# Patient Record
Sex: Female | Born: 1985 | Race: White | Hispanic: No | Marital: Married | Smoking: Former smoker
Health system: Southern US, Community
[De-identification: ages and names within clinical notes are randomized; demographics above are authoritative.]

## PROBLEM LIST (undated history)

## (undated) DIAGNOSIS — K219 Gastro-esophageal reflux disease without esophagitis: Secondary | ICD-10-CM

## (undated) DIAGNOSIS — I7300 Raynaud's syndrome without gangrene: Secondary | ICD-10-CM

## (undated) DIAGNOSIS — I10 Essential (primary) hypertension: Secondary | ICD-10-CM

## (undated) DIAGNOSIS — M3214 Glomerular disease in systemic lupus erythematosus: Secondary | ICD-10-CM

## (undated) HISTORY — PX: OTHER SURGICAL HISTORY: SHX169

## (undated) HISTORY — PX: TONSILLECTOMY AND ADENOIDECTOMY: SHX28

## (undated) HISTORY — PX: RENAL BIOPSY: SHX156

## (undated) HISTORY — PX: WISDOM TOOTH EXTRACTION: SHX21

---
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
## 2005-10-20 ENCOUNTER — Encounter: Payer: Self-pay | Admitting: Vascular Surgery

## 2005-10-20 ENCOUNTER — Ambulatory Visit (HOSPITAL_COMMUNITY): Admission: RE | Admit: 2005-10-20 | Discharge: 2005-10-20 | Payer: Self-pay | Admitting: Rheumatology

## 2006-02-23 ENCOUNTER — Emergency Department (HOSPITAL_COMMUNITY): Admission: EM | Admit: 2006-02-23 | Discharge: 2006-02-23 | Payer: Self-pay | Admitting: Emergency Medicine

## 2006-03-08 ENCOUNTER — Ambulatory Visit: Payer: Self-pay

## 2006-03-15 ENCOUNTER — Encounter: Payer: Self-pay | Admitting: Cardiology

## 2006-03-15 ENCOUNTER — Ambulatory Visit: Payer: Self-pay

## 2006-06-10 ENCOUNTER — Encounter: Admission: RE | Admit: 2006-06-10 | Discharge: 2006-06-10 | Payer: Self-pay | Admitting: Rheumatology

## 2006-06-16 ENCOUNTER — Encounter: Admission: RE | Admit: 2006-06-16 | Discharge: 2006-06-16 | Payer: Self-pay | Admitting: Rheumatology

## 2006-11-03 ENCOUNTER — Ambulatory Visit (HOSPITAL_COMMUNITY): Admission: RE | Admit: 2006-11-03 | Discharge: 2006-11-03 | Payer: Self-pay | Admitting: Internal Medicine

## 2007-01-12 ENCOUNTER — Ambulatory Visit: Payer: Self-pay | Admitting: Hematology and Oncology

## 2007-01-20 LAB — URINALYSIS, MICROSCOPIC - CHCC: Glucose: NEGATIVE g/dL

## 2007-01-20 LAB — CBC & DIFF AND RETIC
IRF: 0.25 (ref 0.130–0.330)
LYMPH%: 18.3 % (ref 14.0–48.0)
MCH: 23.9 pg — ABNORMAL LOW (ref 26.0–34.0)
MCHC: 33.3 g/dL (ref 32.0–36.0)
MCV: 71.9 fL — ABNORMAL LOW (ref 81.0–101.0)
MONO%: 3.1 % (ref 0.0–13.0)
RBC: 3.32 10*6/uL — ABNORMAL LOW (ref 3.70–5.32)
Retic %: 0.9 % (ref 0.4–2.3)
WBC: 2.7 10*3/uL — ABNORMAL LOW (ref 3.9–10.0)
lymph#: 0.5 10*3/uL — ABNORMAL LOW (ref 0.9–3.3)

## 2007-01-21 ENCOUNTER — Inpatient Hospital Stay (HOSPITAL_COMMUNITY): Admission: EM | Admit: 2007-01-21 | Discharge: 2007-01-25 | Payer: Self-pay | Admitting: Emergency Medicine

## 2007-01-21 ENCOUNTER — Ambulatory Visit: Payer: Self-pay | Admitting: Hematology and Oncology

## 2007-01-24 LAB — IRON AND TIBC
Iron: 19 ug/dL — ABNORMAL LOW (ref 42–145)
TIBC: 222 ug/dL — ABNORMAL LOW (ref 250–470)

## 2007-01-24 LAB — FERRITIN: Ferritin: 362 ng/mL — ABNORMAL HIGH (ref 10–291)

## 2007-01-24 LAB — HEMOGLOBINOPATHY EVALUATION
Hemoglobin Other: 0 % (ref 0.0–0.0)
Hgb A: 97.1 % (ref 96.8–97.8)
Hgb F Quant: 0 % (ref 0.0–2.0)
Hgb S Quant: 0 % (ref 0.0–0.0)

## 2007-01-24 LAB — COMPREHENSIVE METABOLIC PANEL
Alkaline Phosphatase: 49 U/L (ref 39–117)
CO2: 24 mEq/L (ref 19–32)
Calcium: 7.9 mg/dL — ABNORMAL LOW (ref 8.4–10.5)
Creatinine, Ser: 0.56 mg/dL (ref 0.40–1.20)
Potassium: 4.1 mEq/L (ref 3.5–5.3)
Total Bilirubin: 0.4 mg/dL (ref 0.3–1.2)

## 2007-01-24 LAB — PROTEIN ELECTROPHORESIS, SERUM
Albumin ELP: 43.9 % — ABNORMAL LOW (ref 55.8–66.1)
Alpha-1-Globulin: 6.3 % — ABNORMAL HIGH (ref 2.9–4.9)
Gamma Globulin: 26.4 % — ABNORMAL HIGH (ref 11.1–18.8)

## 2007-01-25 ENCOUNTER — Encounter: Payer: Self-pay | Admitting: Hematology and Oncology

## 2007-02-04 LAB — BASIC METABOLIC PANEL
BUN: 16 mg/dL (ref 6–23)
CO2: 24 mEq/L (ref 19–32)
Chloride: 108 mEq/L (ref 96–112)
Creatinine, Ser: 0.63 mg/dL (ref 0.40–1.20)

## 2007-02-04 LAB — CBC WITH DIFFERENTIAL/PLATELET
BASO%: 0.1 % (ref 0.0–2.0)
EOS%: 1.6 % (ref 0.0–7.0)
HCT: 30.2 % — ABNORMAL LOW (ref 34.8–46.6)
MCH: 24.9 pg — ABNORMAL LOW (ref 26.0–34.0)
MCHC: 33.5 g/dL (ref 32.0–36.0)
MONO#: 0.1 10*3/uL (ref 0.1–0.9)
NEUT%: 61.4 % (ref 39.6–76.8)
RBC: 4.06 10*6/uL (ref 3.70–5.32)
RDW: 20 % — ABNORMAL HIGH (ref 11.3–14.5)
WBC: 2.2 10*3/uL — ABNORMAL LOW (ref 3.9–10.0)
lymph#: 0.7 10*3/uL — ABNORMAL LOW (ref 0.9–3.3)

## 2007-02-04 LAB — TECHNOLOGIST REVIEW

## 2007-02-10 DIAGNOSIS — M3214 Glomerular disease in systemic lupus erythematosus: Secondary | ICD-10-CM

## 2007-02-10 HISTORY — DX: Glomerular disease in systemic lupus erythematosus: M32.14

## 2007-02-18 ENCOUNTER — Ambulatory Visit (HOSPITAL_COMMUNITY): Admission: RE | Admit: 2007-02-18 | Discharge: 2007-02-19 | Payer: Self-pay | Admitting: Nephrology

## 2007-02-18 ENCOUNTER — Encounter (INDEPENDENT_AMBULATORY_CARE_PROVIDER_SITE_OTHER): Payer: Self-pay | Admitting: Nephrology

## 2007-03-03 ENCOUNTER — Ambulatory Visit (HOSPITAL_COMMUNITY): Admission: RE | Admit: 2007-03-03 | Discharge: 2007-03-03 | Payer: Self-pay | Admitting: Nephrology

## 2007-03-14 ENCOUNTER — Encounter: Admission: RE | Admit: 2007-03-14 | Discharge: 2007-03-14 | Payer: Self-pay | Admitting: Rheumatology

## 2007-04-06 ENCOUNTER — Encounter (HOSPITAL_COMMUNITY): Admission: RE | Admit: 2007-04-06 | Discharge: 2007-07-05 | Payer: Self-pay | Admitting: Nephrology

## 2008-02-22 IMAGING — CR DG CHEST 2V
2 series · 2 of 2 positions shown · non-contrast
Comparison: None available.

CLINICAL DATA: Chest pain and shortness of breath since [REDACTED].  History of Lupus.  
CHEST - 2 VIEW:

[view not recorded (1 of 2)]
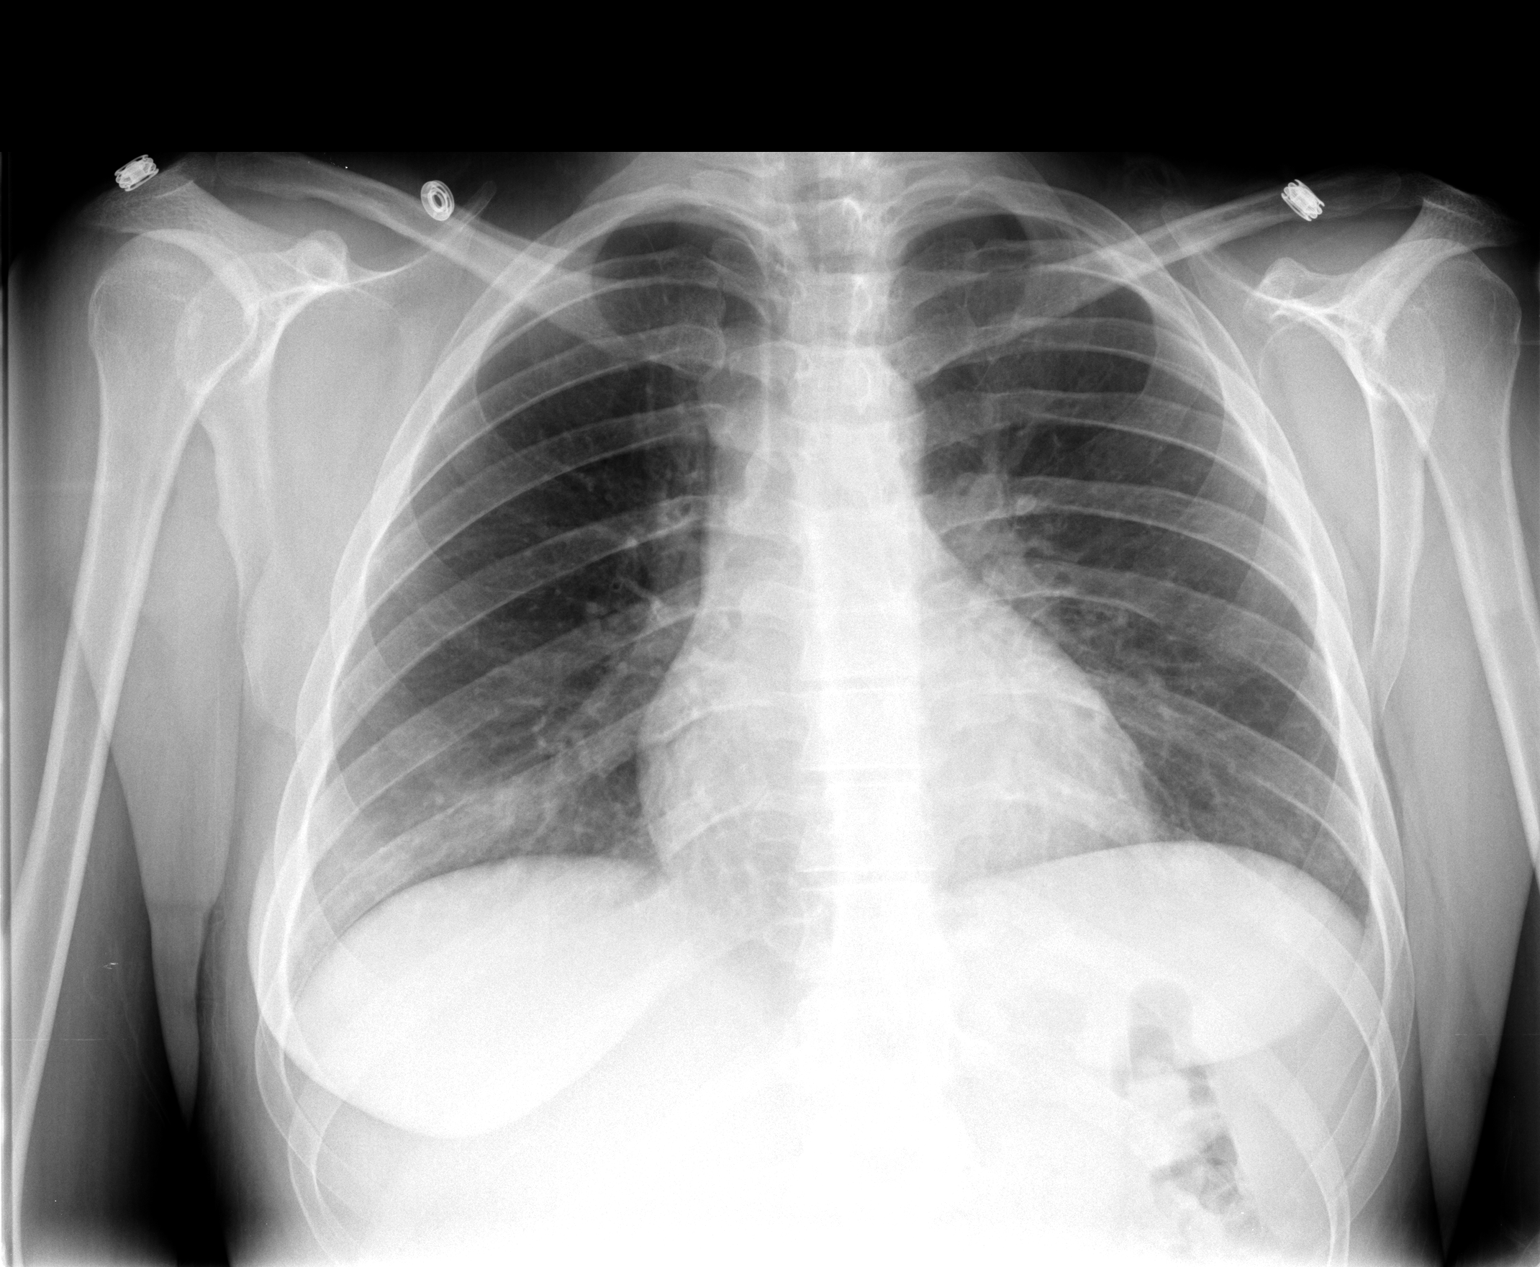

[view not recorded (2 of 2)]
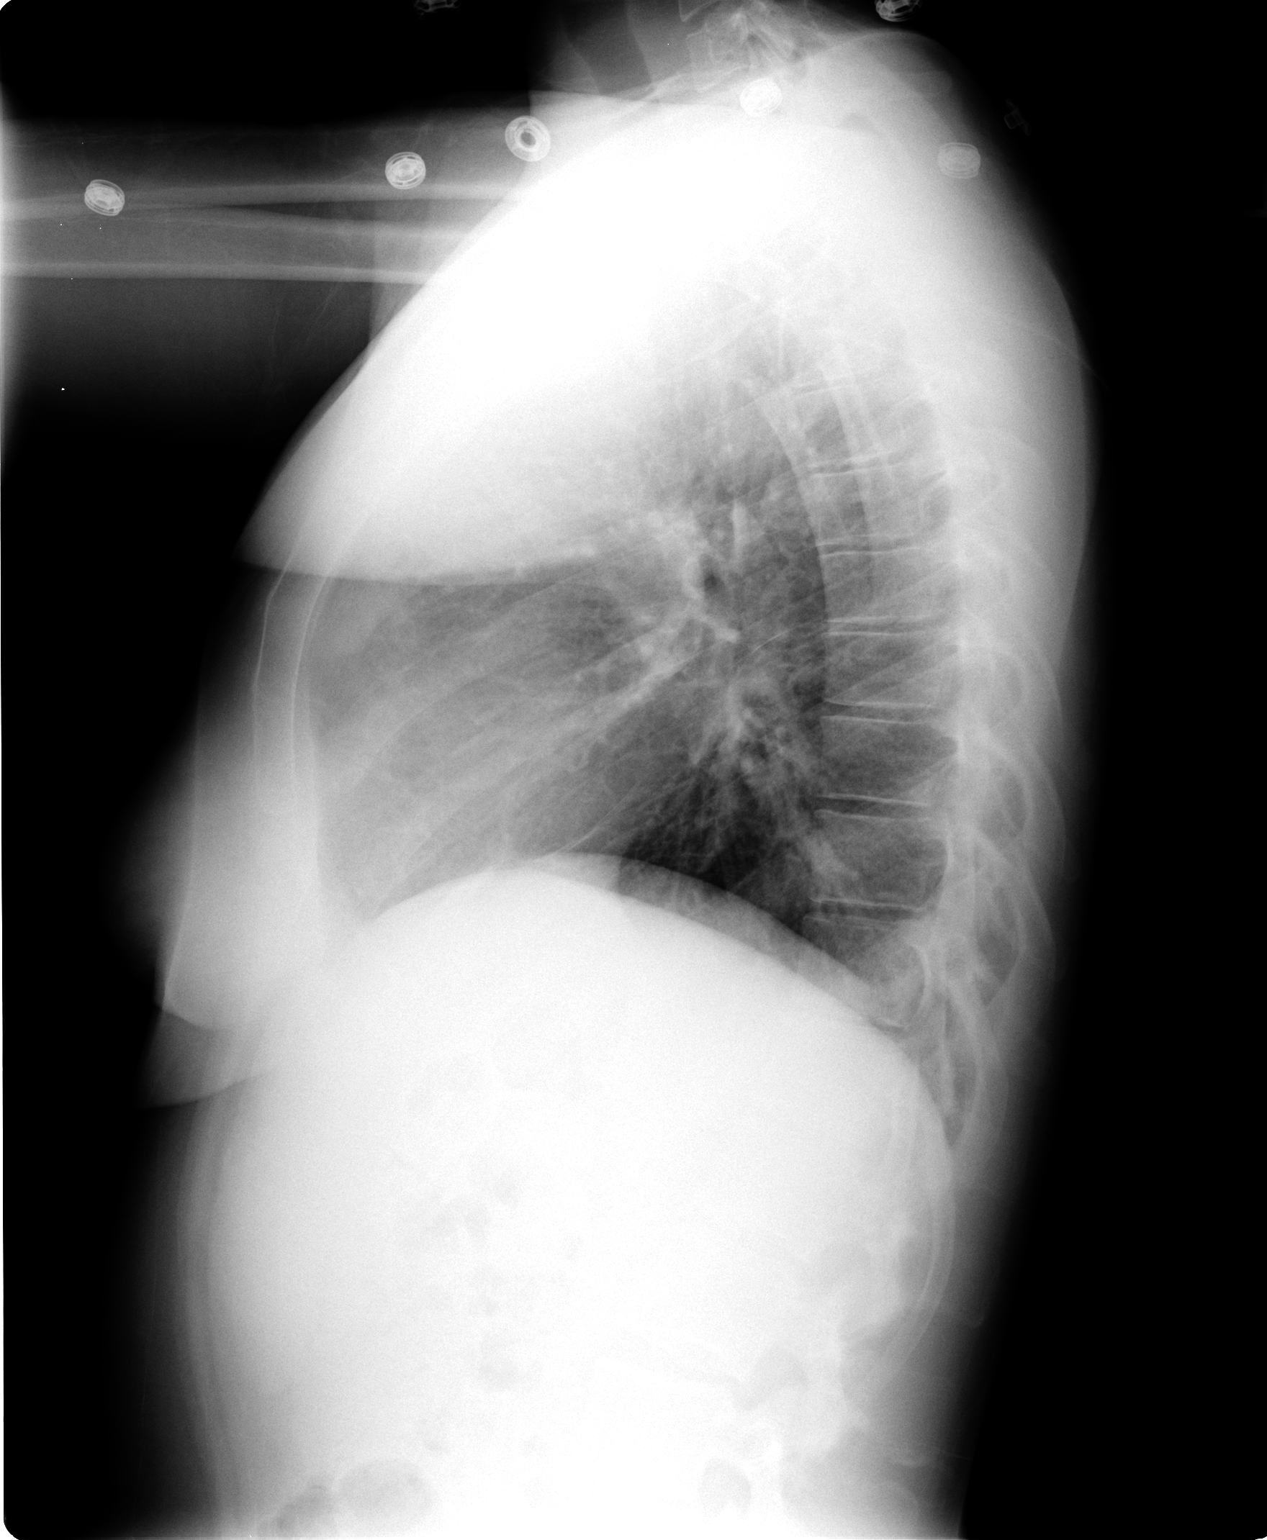

[2 of 2 positions shown; findings below may reference images not displayed]

FINDINGS: The trachea is midline.  The heart size is normal.  Mediastinal contours are within normal limits.  Costophrenic angles are sharp.  There is patchy right lower lobe airspace disease most apparent on the frontal view.  Left lung is clear.
IMPRESSION: Patchy right lower lobe airspace disease most consistent with pneumonia or atelectasis.

## 2008-02-22 IMAGING — CT CT ANGIO CHEST
2 of 4 series · 19 of 36 positions shown · IV contrast (APPLIED)
Comparison: Plain film earlier in the day.

CLINICAL DATA: Chest pain.  Rule out pulmonary embolism.
CT ANGIOGRAPHY OF CHEST:
TECHNIQUE: Multidetector CT imaging of the chest was performed during bolus injection of intravenous contrast.  Multiplanar CT angiographic image reconstructions were generated to evaluate the vascular anatomy.
Contrast:  80 cc Omnipaque 300.

[Series 4: pulm embolism 2.0 st · axial · 0.61mm/px · z∈[-285,-37]mm · 16 of 136 slices shown]
[im 6/136  lung]
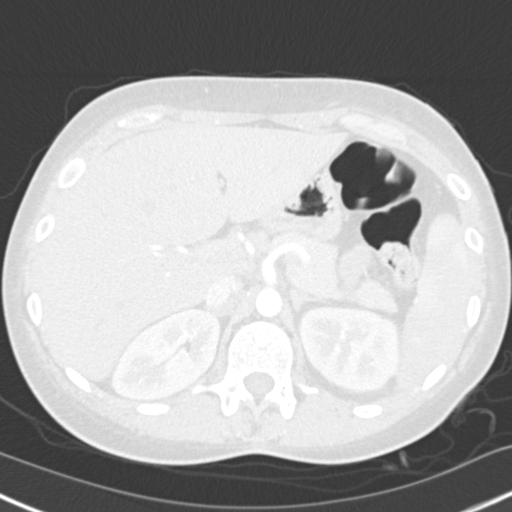
[im 17/136  mediastinal]
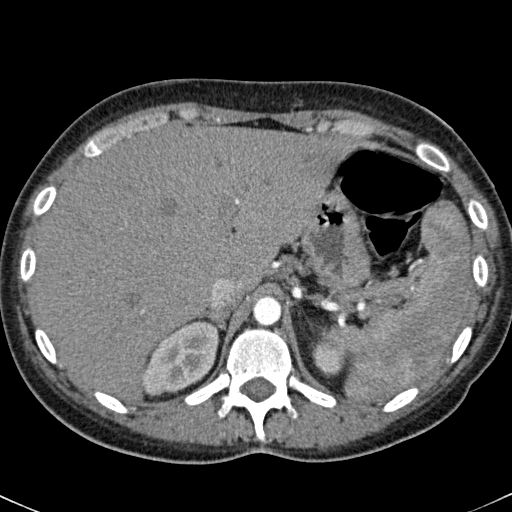
[im 22/136  lung]
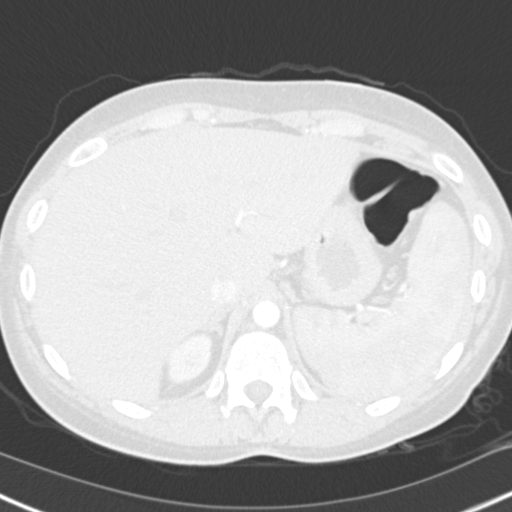
[im 33/136  mediastinal]
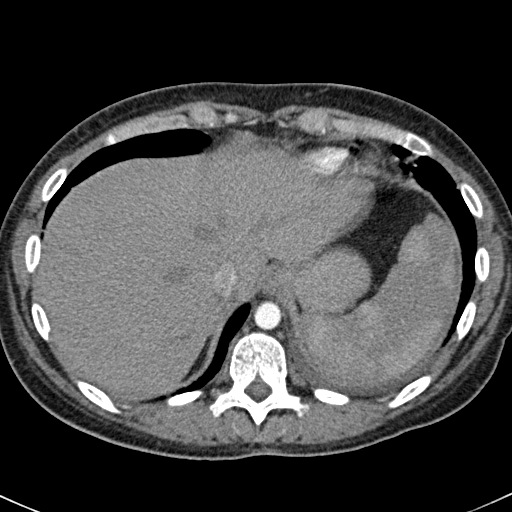
[im 38/136  lung]
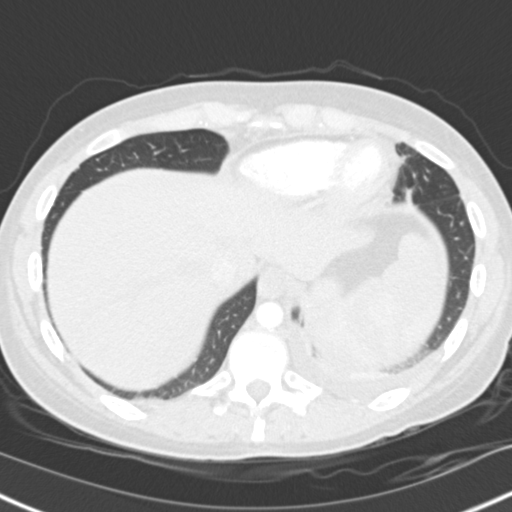
[im 49/136  mediastinal]
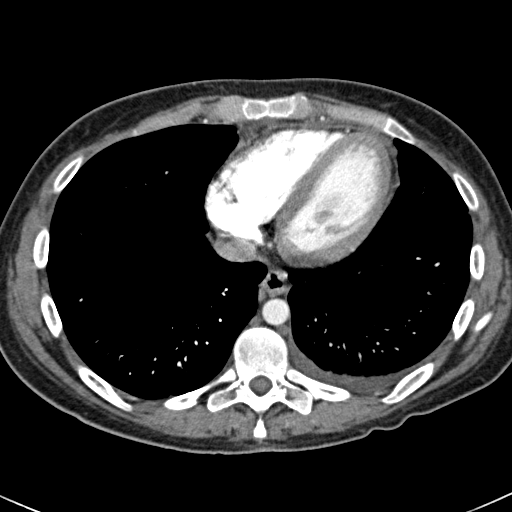
[im 55/136  lung]
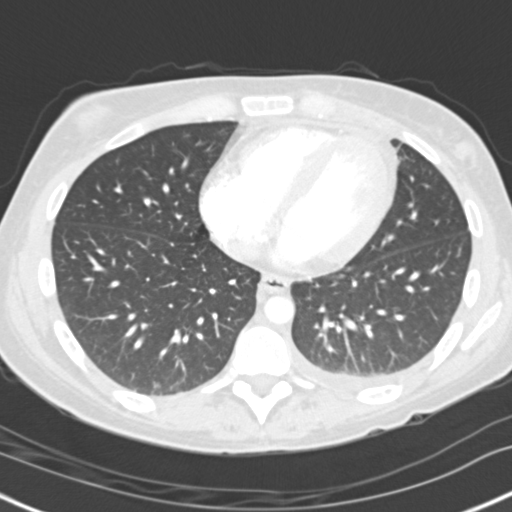
[im 65/136  mediastinal]
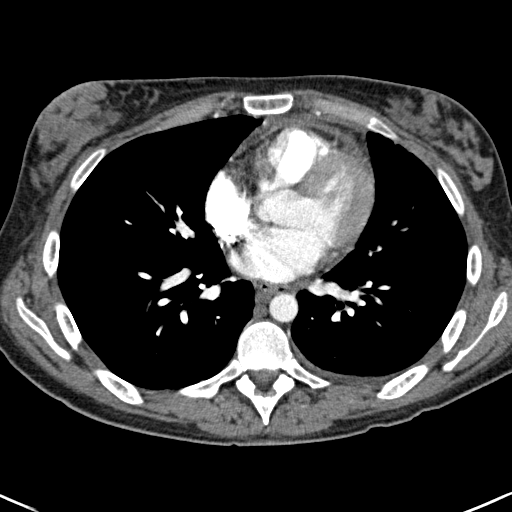
[im 71/136  lung]
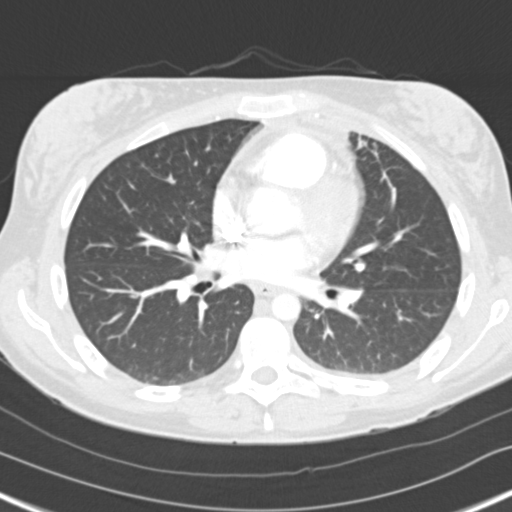
[im 82/136  mediastinal]
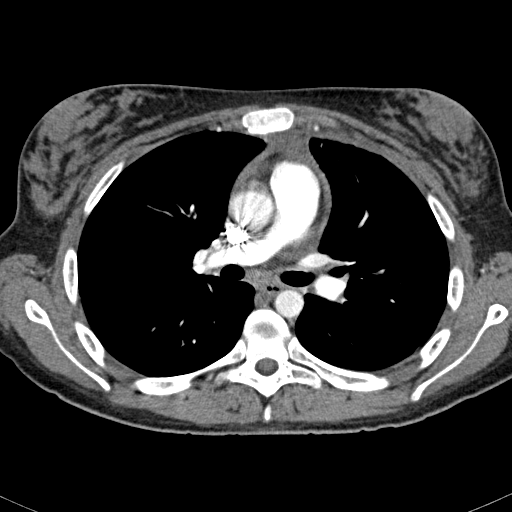
[im 87/136  lung]
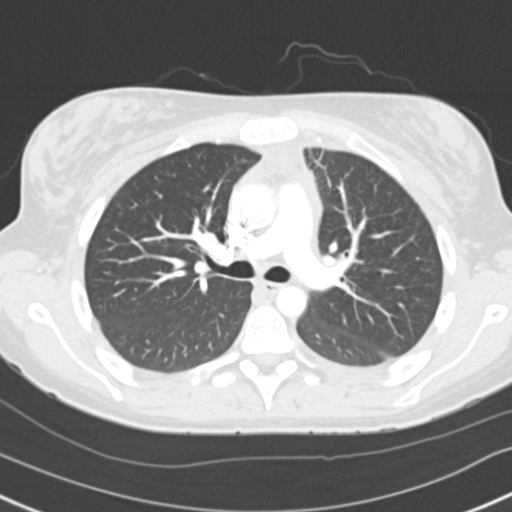
[im 98/136  mediastinal]
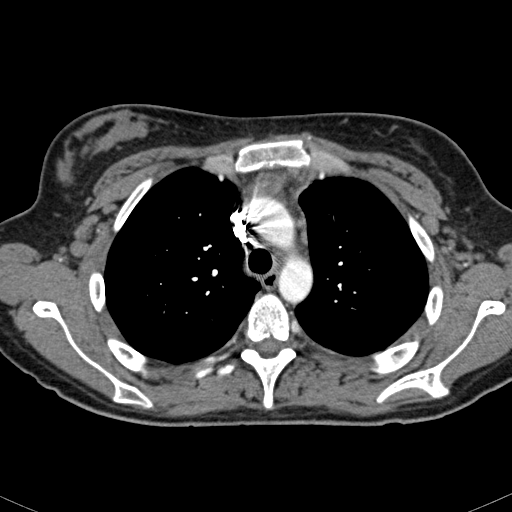
[im 103/136  lung]
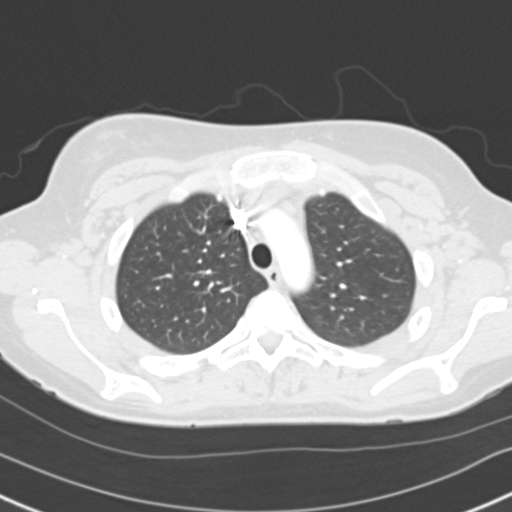
[im 114/136  mediastinal]
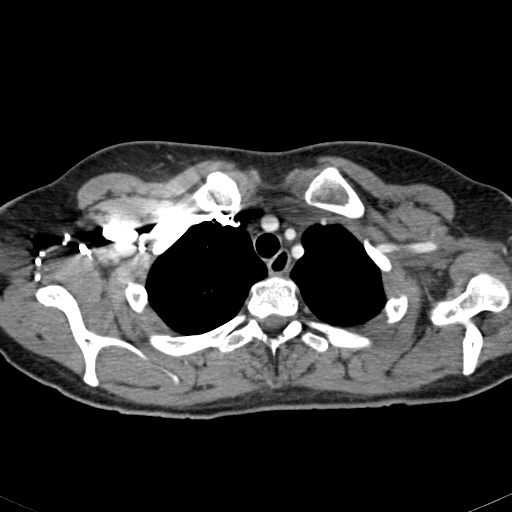
[im 119/136  lung]
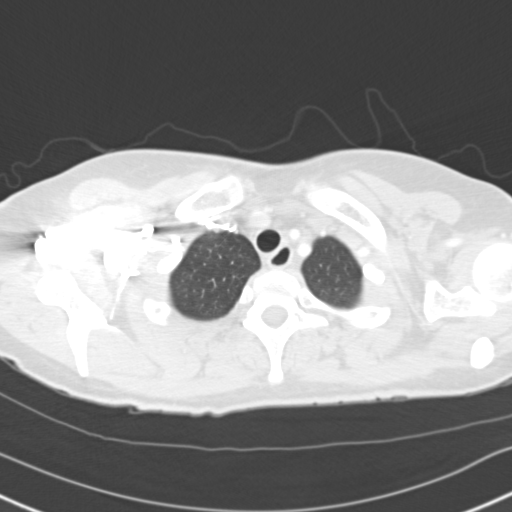
[im 130/136  mediastinal]
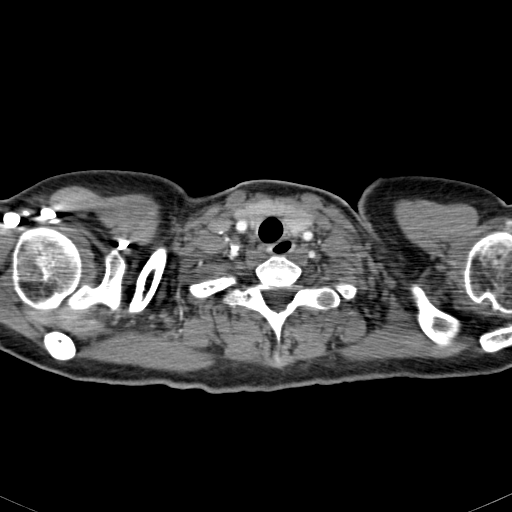

[Series 6: pulm embolism 2.0 cor · coronal · 0.63mm/px · 3 of 109 slices shown]
[im 22/109  mediastinal]
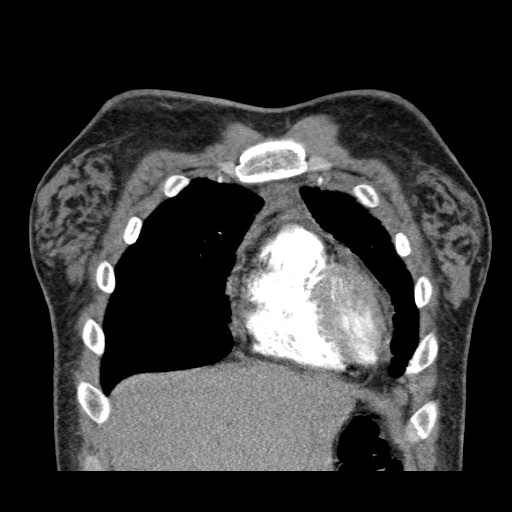
[im 44/109  mediastinal]
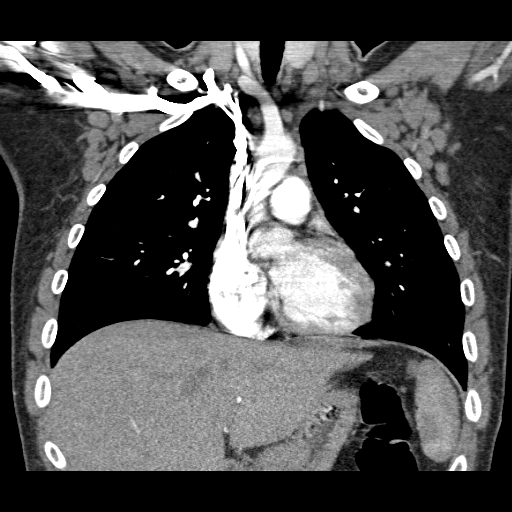
[im 65/109  mediastinal]
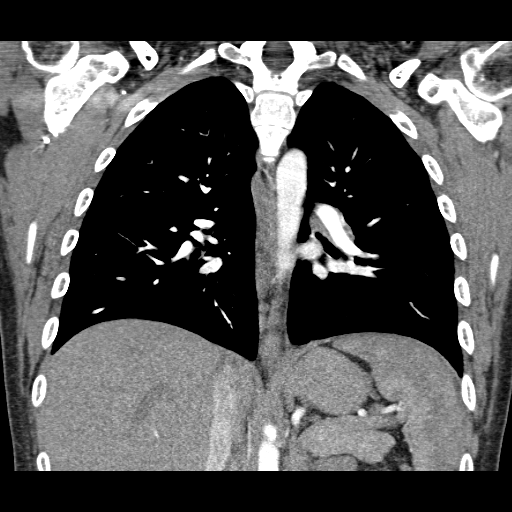

[19 of 36 positions shown; findings below may reference images not displayed]

FINDINGS: Lung windows demonstrate no airspace opacities.  The right lower lobe opacity on the plain film was likely related to atelectasis, which has resolved.  Minimal scarring involves the lingula.  
Soft tissue windows:  The quality of this exam for evaluation of pulmonary embolism is good.  No filling defect is in the pulmonary arterial tree to suggest pulmonary embolism.  There is axillary adenopathy with increased size and number of bilateral axillary nodes.  There is left greater than right supraclavicular adenopathy.  The heart size is normal.  There is trace pericardial fluid.  There is a small left-sided pleural effusion.  Numerous small mediastinal lymph nodes.  Prominent left infrahilar nodal tissue.  
Limited imaging of the upper abdomen demonstrates increased number of small porta hepatis and gastrohepatic ligament lymph nodes.  
bone windows demonstrate no worrisome osseous lesion.
IMPRESSION: 1.  No evidence of pulmonary embolism.
2.  Small left-sided pleural effusion and trace pericardial fluid may relate to the given history of lupus. 
3.  The right lower lobe airspace opacity does not persist and was likely related to atelectasis.  
4.  Thoracic adenopathy.  Although this could relate to chronic inflammation in this patient with lupus, consider other etiologies including lymphoproliferative disorders.  Consider initially correlation with blood work and physical exam.  If there is a suspicion of lymphoma or other lymphoproliferative process, consider PET/CT.

## 2008-08-13 ENCOUNTER — Inpatient Hospital Stay (HOSPITAL_COMMUNITY): Admission: AD | Admit: 2008-08-13 | Discharge: 2008-08-13 | Payer: Self-pay | Admitting: Obstetrics and Gynecology

## 2008-09-10 ENCOUNTER — Inpatient Hospital Stay (HOSPITAL_COMMUNITY): Admission: AD | Admit: 2008-09-10 | Discharge: 2008-09-13 | Payer: Self-pay | Admitting: Obstetrics & Gynecology

## 2008-10-12 ENCOUNTER — Encounter: Payer: Self-pay | Admitting: Gastroenterology

## 2009-01-19 IMAGING — CR DG CHEST 1V PORT
1 series · 1 of 1 positions shown · non-contrast
Comparison: 01/20/07.

CLINICAL DATA: Pneumonia. 
 PORTABLE CHEST - 1 VIEW:

[view not recorded]
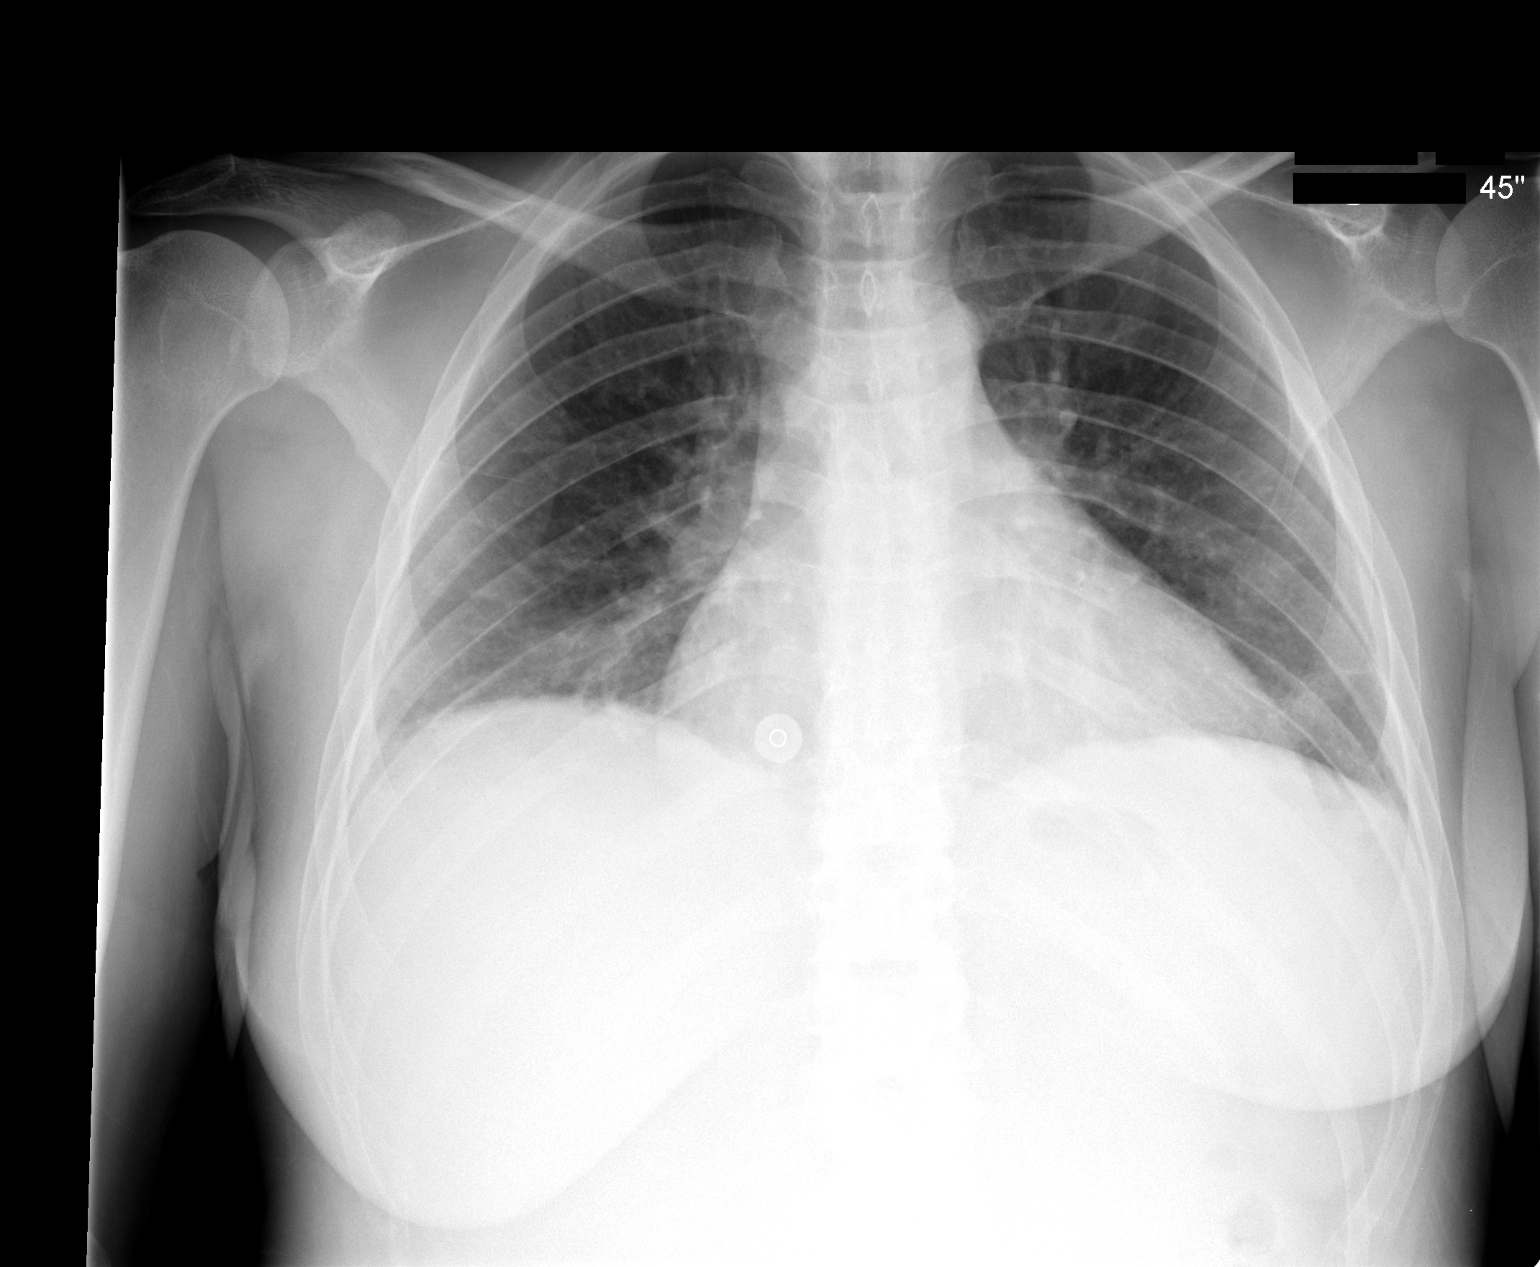

[1 of 1 positions shown; findings below may reference images not displayed]

FINDINGS: Patchy density at the right base is stable.  The heart is enlarged.  No pneumothoraces or effusions are seen.  A small right pleural effusion is not significantly changed.
IMPRESSION: No interval change.

## 2009-01-23 ENCOUNTER — Emergency Department (HOSPITAL_COMMUNITY): Admission: EM | Admit: 2009-01-23 | Discharge: 2009-01-23 | Payer: Self-pay | Admitting: Emergency Medicine

## 2009-02-16 IMAGING — US US BIOPSY
1 series · 14 of 14 positions shown · non-contrast
Comparison: none

CLINICAL DATA: Proteinuria. 
 ULTRASOUND GUIDANCE FOR RENAL BIOPSY:

[Series 1: unknown · 0.27mm/px · 14 of 14 slices shown]
[im 1/14]
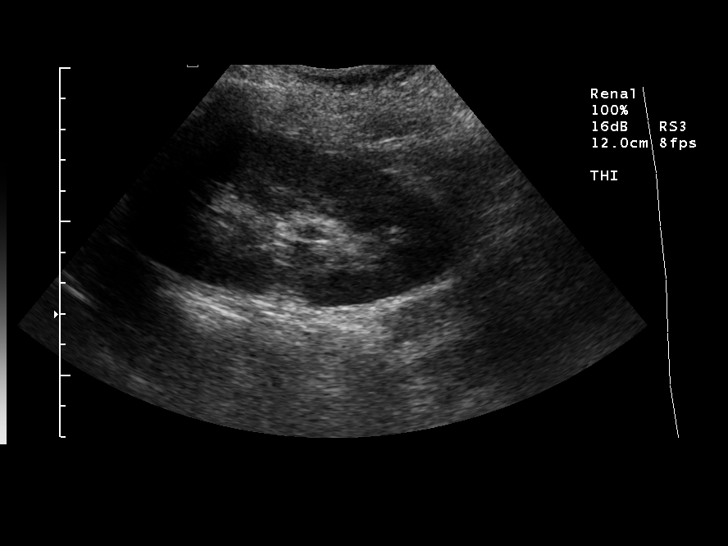
[im 2/14]
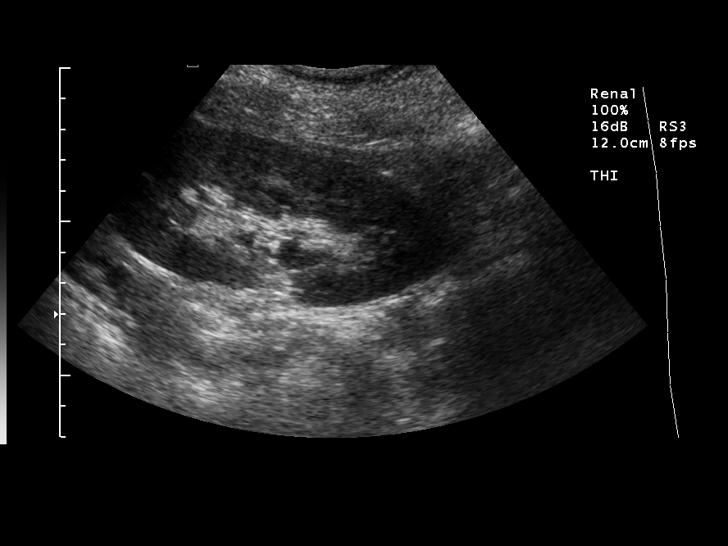
[im 3/14]
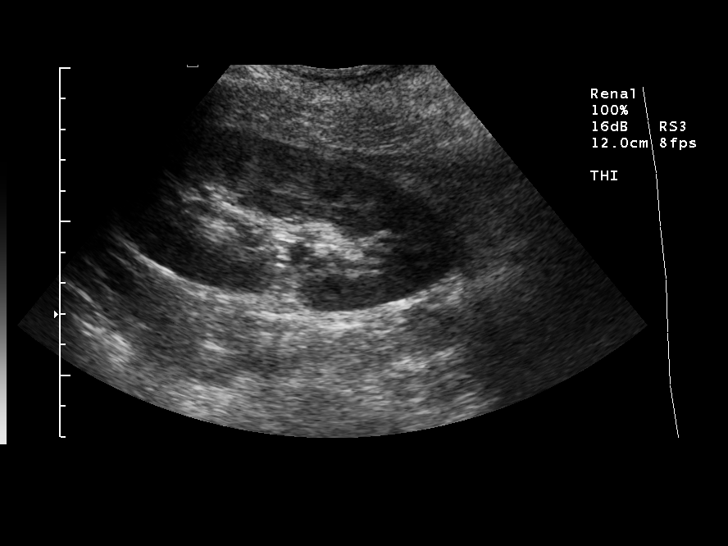
[im 4/14]
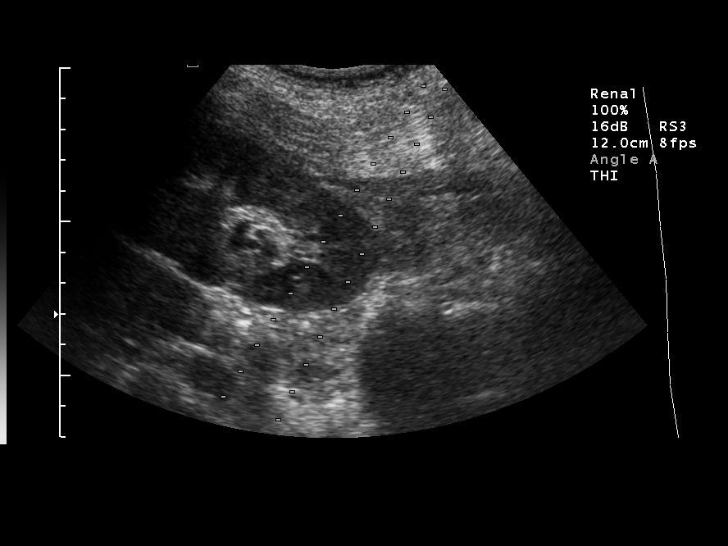
[im 5/14]
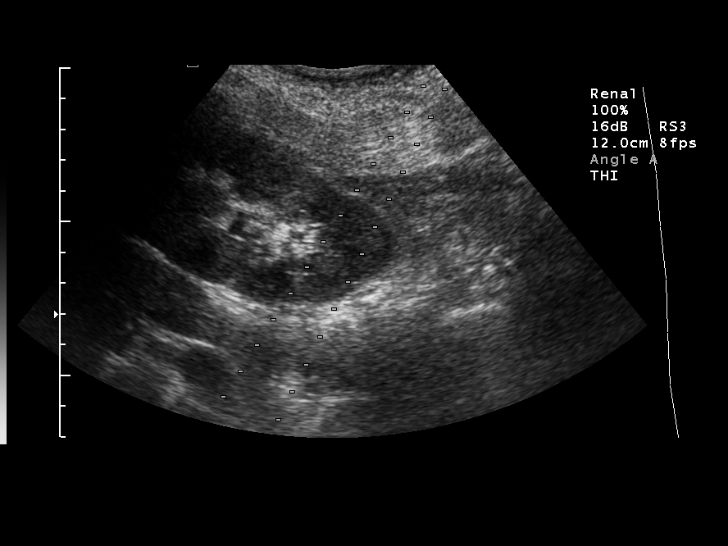
[im 6/14]
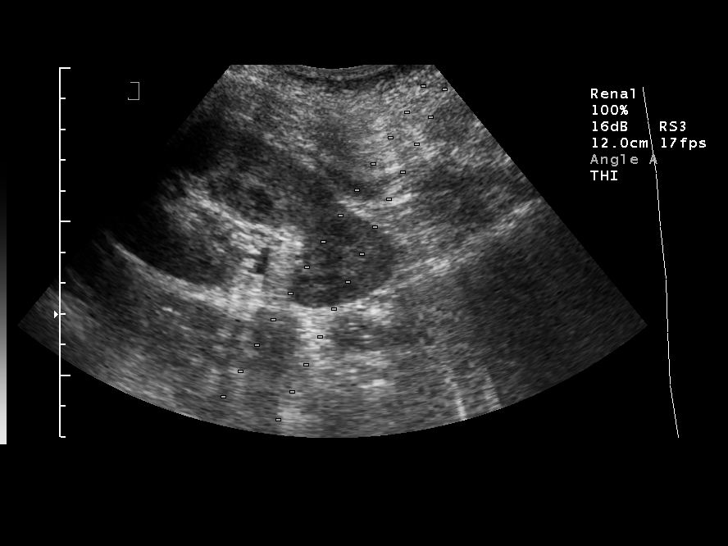
[im 7/14]
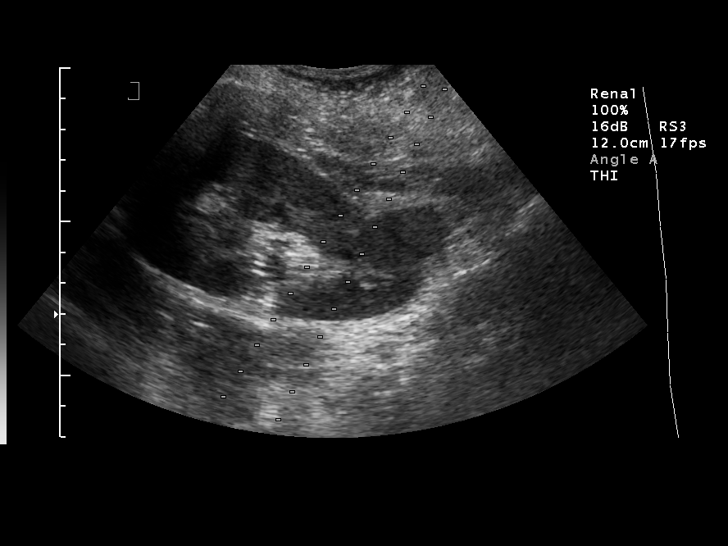
[im 8/14]
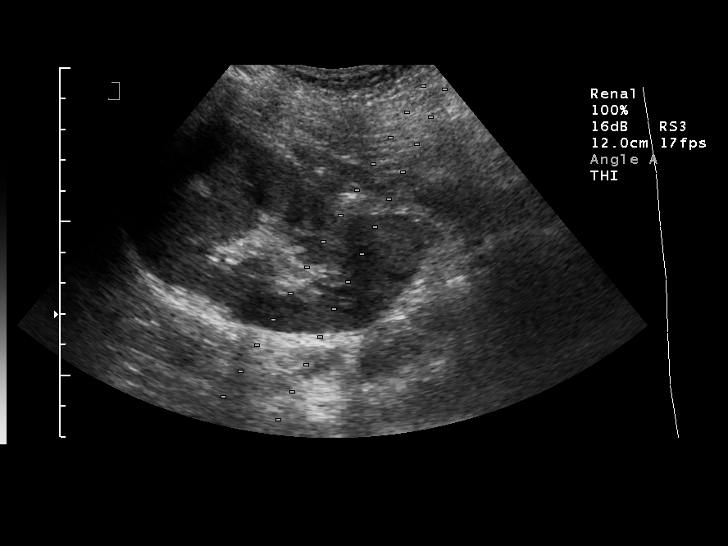
[im 9/14]
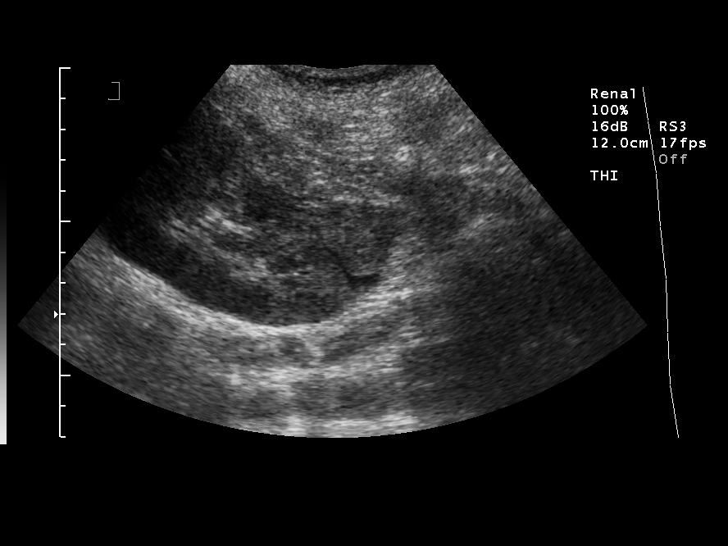
[im 10/14]
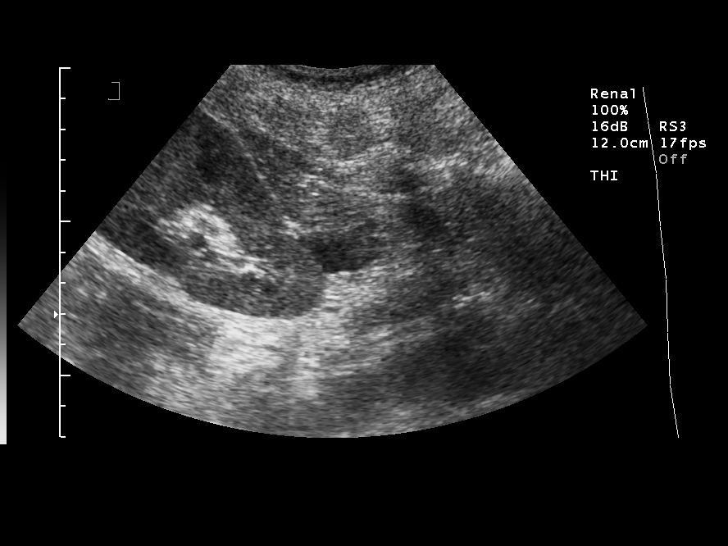
[im 11/14]
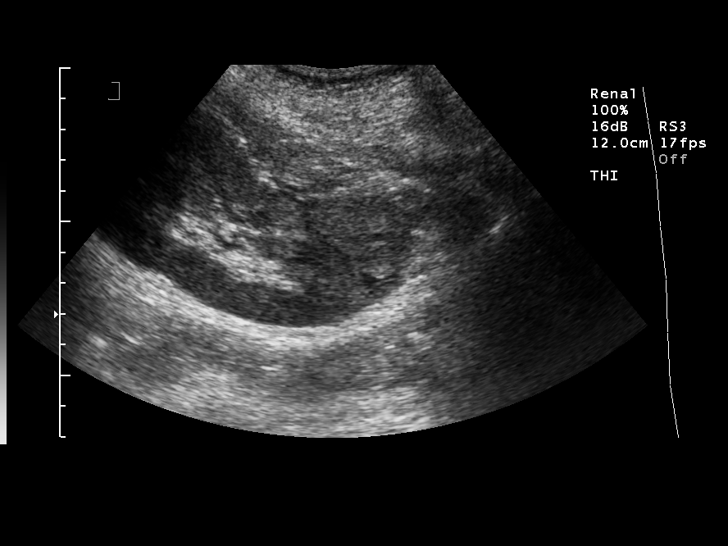
[im 12/14]
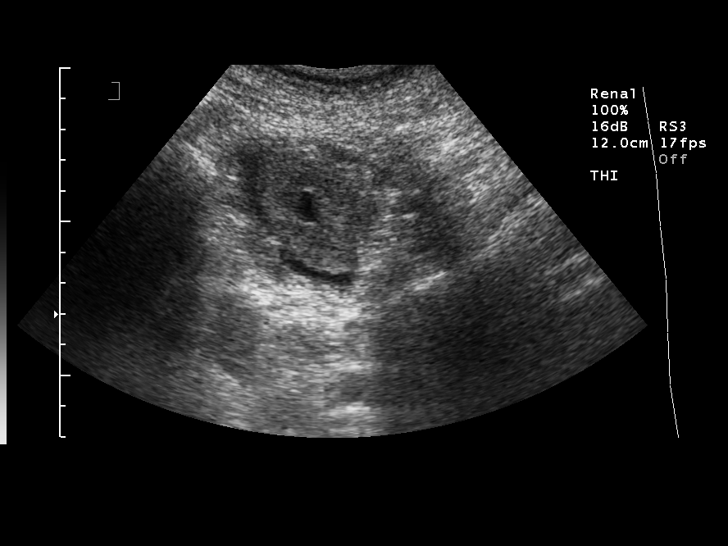
[im 13/14]
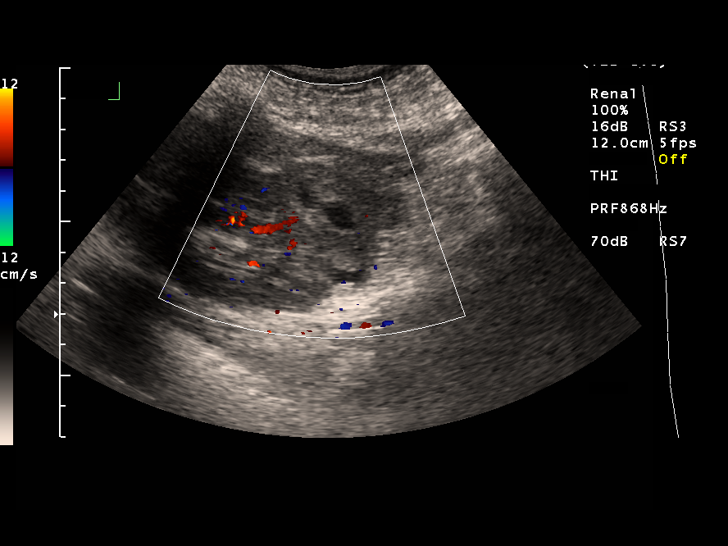
[im 14/14]
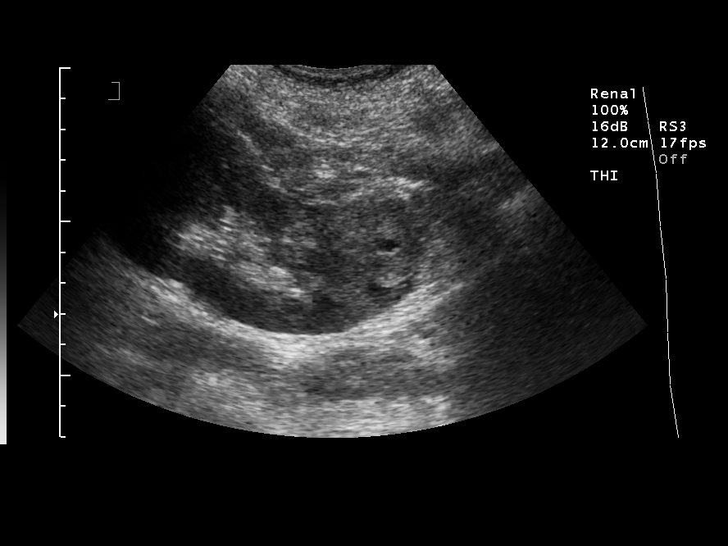

[14 of 14 positions shown; findings below may reference images not displayed]

FINDINGS: Ultrasound guidance was provided for Dr. Ceejay during biopsy of the left kidney.  A radiologist was not present.
IMPRESSION: Ultrasound guidance provided for left renal biopsy.

## 2009-02-22 ENCOUNTER — Encounter: Payer: Self-pay | Admitting: Gastroenterology

## 2010-03-11 NOTE — Letter (Signed)
Summary: [facility]  [facility]   Imported By: Lester Kane 03/12/2009 10:44:57  _____________________________________________________________________  External Attachment:    Type:   Image     Comment:   External Document

## 2010-05-17 LAB — CBC
Hemoglobin: 9.3 g/dL — ABNORMAL LOW (ref 12.0–15.0)
MCV: 82.1 fL (ref 78.0–100.0)
MCV: 82.4 fL (ref 78.0–100.0)
Platelets: 156 10*3/uL (ref 150–400)
Platelets: 196 10*3/uL (ref 150–400)
RBC: 3.25 MIL/uL — ABNORMAL LOW (ref 3.87–5.11)
RBC: 4.03 MIL/uL (ref 3.87–5.11)
RDW: 13.7 % (ref 11.5–15.5)
WBC: 5.2 10*3/uL (ref 4.0–10.5)
WBC: 5.8 10*3/uL (ref 4.0–10.5)

## 2010-05-17 LAB — RPR: RPR Ser Ql: NONREACTIVE

## 2010-05-18 LAB — URINALYSIS, ROUTINE W REFLEX MICROSCOPIC
Bilirubin Urine: NEGATIVE
Glucose, UA: NEGATIVE mg/dL
Hgb urine dipstick: NEGATIVE
Ketones, ur: NEGATIVE mg/dL
Nitrite: NEGATIVE
Protein, ur: NEGATIVE mg/dL
Specific Gravity, Urine: 1.015 (ref 1.005–1.030)
Urobilinogen, UA: 0.2 mg/dL (ref 0.0–1.0)

## 2010-06-24 NOTE — Op Note (Signed)
NAMETHARON, KITCH          ACCOUNT NO.:  [id number]   MEDICAL RECORD NO.:  [id number]          PATIENT TYPE:  INP   LOCATION:  9142                          FACILITY:  [facility]   PHYSICIAN:  Gerrit Friends. Aldona Bar, M.D.   DATE OF BIRTH:  1985/10/11   DATE OF PROCEDURE:  09/10/2008  DATE OF DISCHARGE:                               OPERATIVE REPORT   PREOPERATIVE DIAGNOSES:  A 39-week intrauterine pregnancy, failed  induction attempt - induction carried out for low amniotic fluid volume  and spontaneous variable decelerations.   POSTOPERATIVE DIAGNOSES:  A 39-week intrauterine pregnancy, failed  induction attempt - induction carried out for low amniotic fluid volume  and spontaneous variable decelerations, delivery of 6 pounds 10 ounces  female infant with Apgars 8 and 9.   PROCEDURE:  Primary low transverse cesarean section.   SURGEON:  Gerrit Friends. Aldona Bar, MD   ANESTHESIA:  Spinal.   ANESTHESIOLOGIST:  Dr. Jean Rosenthal   HISTORY:  Ariel Johns is a 25 year old primigravida, who  presented to [facility] on the afternoon of September 10, 2008, for evaluation of  decreased fetal movement.  She had a reactive nonstress test, but there  were some variable decelerations noted prompting an ultrasound to look  for amniotic fluid volume.  Amniotic fluid index was 6.  Her nonstress  test was reactive after her ultrasound.  Consultation (informal) with  Dr. Margot Ables, resulted in admission for induction.  This was my feeling  as well.  The patient's pregnancy was complicated by positive group B  strep.  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  Cytotec dose  was placed approximately 5:00 p.m. and as well she was begun on  intravenous penicillin per protocol for her positive group B strep  culture.  I was called at approximately 7:00 p.m. because of very  impressive deceleration - essentially the fetal heart dropped into the  60s for approximately 1-1/2 to 2 minutes with recovery with position  change and a subsequent reassuring to reactive fetal heart tracing.  The  cause of this was a response that I felt was going to resolve and fetal  intolerance to contractions, a decision was made after discussion with  the patient and her husband to proceed with delivery by cesarean  section.   The patient was taken to the operating room where after satisfactory  induction of spinal anesthetic by Dr. Jean Rosenthal, the patient was prepped  and draped having placed in the supine position, slightly tilted left.  Foley catheter was placed during the prep.   Once good anesthetic levels were documented, procedure was begun.  A  Pfannenstiel incision was made with minimal difficulty dissected down  sharply to and through the fascia in a low transverse fashion with  hemostasis created at each layer.  Subfascial space was created  inferiorly and superiorly, muscles separated in the midline, peritoneum  identified and appropriate care taken to avoid the bowel superiorly and  the bladder inferiorly.  At this time, vesicouterine peritoneum was  identified and incised in low transverse fashion and pushed off  the  lower uterine segment with ease.  Sharp incision into the uterus in a  low transverse fashion was then made with the Metzenbaum scissors.  Amniotomy was produced with production of minimal amount of fluid which  was clear.  Thereafter from vertex position, a viable female infant  which cried spontaneously was delivered.  After cord was clamped and  cut, the infant was passed off to the awaiting team.  Subsequent Apgars  were assigned at 8 and 9 and the weight was found to be 6 pounds 10  ounces and the baby was taken to the newborn nursery in good condition.   Cord bloods were collected, placenta was delivered intact.  At this  time, the uterus was exteriorized, rendered free of any remaining  products of conception and good  uterine contractility was afforded with  slowly given intravenous Pitocin and manual stimulation.  The uterine  incision was then closed using a running #1 Vicryl in a locking fashion  and this was oversewn with several figure-of-eight #1 Vicryls with good  hemostasis produced.  Tubes and ovaries were inspected and noted to be  normal and with the incision dry.  The uterus well contracted and all  counts being correct with no foreign bodies noted to be remaining in the  abdominal cavity.  Uterus was replaced into the abdominal cavity and  closure of the abdomen was begun in layers.  The abdominal peritoneum  was closed with 0 Vicryl in running fashion and muscle secured with  same.  Assured of good subfascial hemostasis, the fascia was then  reapproximated using 0 Vicryl from angle to midline bilaterally.  Subcutaneous tissues were hemostatic and staples were used to close  skin.  Sterile pressure dressing was applied.  At this time, the patient  was transported to recovery in satisfactory condition having tolerated  the procedure well.  Estimated blood loss 500 mL.  All counts were  correct x2.   In summary, this patient presented with decreased fetal movement and was  found to have on ultrasound decreased amniotic fluid volume which  explained her sporadic and mild decelerations, variable nature.  There  were noted in maternity admissions.  Tracing was otherwise reactive.  Decision was made to admit the patient for induction as she was 39  weeks' gestation.  The patient and her husband were both aware of the  increased likelihood of cesarean section, but also aware of the unknown  potential that decreased amniotic fluid volume and cord compression  could cause.   Fetal intolerance to contractions was demonstrated early on induction  and the patient was taken to the operating room for cesarean section, at  which time she delivered a 6 pounds 10 ounces female infant with good   Apgars.   CONCLUSION OF PROCEDURE:  Both mother and baby were doing well in the  respective recovery areas.      Gerrit Friends. Aldona Bar, M.D.  Electronically Signed     RMW/MEDQ  D:  09/10/2008  T:  09/11/2008  Job:  [id number]

## 2010-06-24 NOTE — Consult Note (Signed)
Ariel Johns, Ariel Johns NO.:  [id number]   MEDICAL RECORD NO.:  [id number]         PATIENT TYPE:  LINP   LOCATION:                               FACILITY:  [facility]   PHYSICIAN:  Terrial Rhodes, M.D.DATE OF BIRTH:  08-15-1985   DATE OF CONSULTATION:  01/21/2007  DATE OF DISCHARGE:                                 CONSULTATION   CONSULTING PHYSICIAN:  Trula Slade, MD.   REASON FOR CONSULTATION:  Lupus with proteinuria and hematuria.   HISTORY OF PRESENT ILLNESS:  Mrs. Ariel Johns is a 25 year old white female  with a past medical history significant for systemic lupus erythematosus  first diagnosed at the age of 25, who was admitted today with malaise,  fatigue and bloody urine.  She also has had some shortness of breath and  cough of a brownish sputum, and was admitted for pneumonia and further  workup.  Her chest x-ray showed a right lower lobe infiltrate, but we  were consulted because her urinalysis showed large blood and protein  with 7-10 red blood cells and 3-6 white blood cells.  The patient  normally sees Dr. Coral Spikes as a rheumatologist, and labs done on January 14, 2007 showed that she has had 2+ proteinuria and 1+ hematuria at that  time ,with white cells and red cells present in the urine.  Culture was  negative.  24-hour urine collection was ordered on December 4th,  although it was likely an inadequate specimen.  Has very low creatinine.  Her creatinine was 0.81, and a 24-hour urine protein was 465.3, but only  560 mg of creatinine. we were asked to see the patient to evaluate the  need for a renal biopsy for questionable lupus nephritis.  Also of note,  she has anemia with a hemoglobin of 8 back in November, and she also had  a positive ANA and low compliments back in November.   SHE HAS ALLERGY TO CODEINE.   PAST MEDICAL HISTORY.:  1. Lupus diagnosed at age 25.  2. Anxiety.  3. Raynaud phenomenon.  4. Status post tonsillectomy.  5. Tobacco abuse.   MEDICATIONS:  1. Plaquenil 400 mg at bedtime.  2. Ibuprofen 600 mg t.i.d. p.r.n.   FAMILY HISTORY:  She has a maternal great grandmother who had a  nephrectomy, but died in her 19s.  No other known history of kidney  disease.  No history of lupus.   SOCIAL HISTORY:  She is single.  Denies alcohol or drugs, but does  smoke.  She works as a Production designer, theatre/television/film at [employer], and she is from the  [place].   REVIEW OF SYSTEMS:  GENERAL:  She has had some malaise, fatigue prior to  admission.  OPHTHALMIC:  No blurry vision.  CARDIAC:  No chest pain,  palpitations.  PULMONARY:  She has had some shortness of breath and a  cough productive of brown sputum.  GI:  No nausea, vomiting,  hematochezia, melena or bright-red blood per rectum.  GU:  No dysuria,  pyuria, hematuria, urgency, frequency or retention, but has had some  dark brown colored urine lately.  RHEUMATOLOGIC:  She has diffuse  arthralgias and myalgias.  Also had a malar rash, and was complaining of  ankle edema prior to admission.  DERMATOLOGIC:  She has had a malar  rash.  NEUROLOGIC:  No numbness, tingling, weakness, ataxia, aphasia.  All other systems negative.   PHYSICAL EXAM:  This is a well-developed, well-nourished female in no  apparent distress.  Temperature 97.8, pulse 75, blood pressure 105/60, respiratory rate 16.  HEENT:  Head normocephalic, atraumatic.  No icterus.  Extraocular  muscles intact.  Oropharynx without lesions.  NECK:  Supple.  No lymphadenopathy or bruits.  LUNGS:  Clear to auscultation and percussion bilaterally.  No rales,  rubs or rhonchi.  CARDIAC:  Exam regular rate and rhythm.  No precordial rub appreciated.  ABDOMINAL EXAM:  Normoactive bowel sounds; soft, nontender,  nondistended.  No guarding.  No rebound.  No bruits.  EXTREMITIES:  She  has no clubbing, cyanosis or edema.   LABORATORY DATA:  Her white blood cell count was 2.2, hemoglobin 7.8,  platelets 241.  Sodium 143, potassium 3.5, chloride  113, CO2 25, BUN 15,  creatinine 0.5, glucose 95, calcium 8.5, albumin 2.7.  Urinalysis done  yesterday showed large blood, 100 protein, 3-6 white blood cells, 7-10  red blood cells, many bacteria.   ASSESSMENT/PLAN:  1. 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  The patient had low complements last      month, also non-nephrotic-range proteinuria, but has hematuria.  It      is unclear how long this has been going on, but it was present at      least for the last month.  Her proteinuria is non-nephrotic,      however, this is not a complete specimen, given the low amount of      creatinine in the sample.  We will repeat her 24-hour urine      collection here as well.  I did discuss with she and her family      about the role a kidney biopsy, and it may be warranted at this      time.  However, she is currently on ibuprofen, and will need to      discontinue that.  Follow her bleeding time.  As there is no urgent      indication for a biopsy right now, we will follow her off of her      ibuprofen, and plan for a biopsy on Monday or Tuesday if she      remains here in the hospital.  Since it is here at [facility], we      will ask interventional radiology to perform the ultrasound-guided      biopsy.  I did discuss with her the need to discontinue the      ibuprofen, and will follow.  We will also recheck her complement      levels, if these have not been performed in last month.  Again,      there is no urgent indication, however, she remains hospitalized,      we will perform it while she is here.  2. Pneumonia.  She is on antibiotics, slowly improving.  3. Anemia.  She has a low hemoglobin.  We will start her on EPO and      follow.  This is likely due to her lupus flare.  She has been      referred to hematology  4. Leukopenia, again, likely due to lupus.  Will follow up with  hematology.  5. Arthralgias.  We will need to find another agent to help control      her pain, given her hematuria  or proteinuria.  We will discontinue      the ibuprofen.  We will try tramadol.  I will deferred to her Dr.      Coral Spikes.   Thank you for this consultation.           ______________________________  Terrial Rhodes, M.D.     JC/MEDQ  D:  01/21/2007  T:  01/22/2007  Job:  [id number]

## 2010-06-24 NOTE — H&P (Signed)
Ariel Johns, Ariel Johns               ACCOUNT NO.:  [id number]   MEDICAL RECORD NO.:  [id number]          PATIENT TYPE:  INP   LOCATION:  1409                         FACILITY:  [facility]   PHYSICIAN:  Hollice Espy, M.D.DATE OF BIRTH:  1985/04/27   DATE OF ADMISSION:  01/21/2007  DATE OF DISCHARGE:                              HISTORY & PHYSICAL   PRIMARY CARE PHYSICIAN:  [name]. Polite, M.D.   CHIEF COMPLAINT:  Bloody urine.   HISTORY OF PRESENT ILLNESS:  The patient is a 25 year old white female  with past medical history of lupus who has had problems with chronic  shortness of breath and cough with brownish sputum for several months  who came to the emergency room today with those similar complaints but  acutely she noted her urine was quite dark and what she thought was  bloody.  When she came into the emergency room, she had labs drawn.  She  was found to have a white count of only 2.7 and hemoglobin of 7.6 with  an MCV of 71.  The rest of her labs were noticed for an albumin of 2.7  and a UA that noted a large hemoglobin but only 7-10 red cells.  Chest x-  ray showed signs or a right lower lobe infiltrate.  The patient was  noted to have a temperature of 101.9 on admission as well and a heart  rate of 113.  She says she feels short of breath but not more so than  usual.  Because there is a suspicion that she may have some suppressed  immune system because of her history of lupus, blood cultures were  drawn, and she was started on IV Rocephin and Zithromax.  The patient  herself says she feels slightly nauseated and complains of a headache.  She denies any vision changes or dysphagia, no chest pain or  palpitations.  She complains of chronic shortness of breath with cough  with brown productive sputum, no wheezing, no abdominal pain.  No  dysuria.  She is having acute dark urine, possibly hematuria.  She  denies any constipation or diarrhea.  No focal history of numbness,  weakness or pain, but overall she feels quite fatigued.  Review of  systems otherwise negative.   PAST MEDICAL HISTORY:  1. Lupus.  2. Anxiety.  3. Raynaud's phenomenon.  4. History of tonsillectomy.  5. She is a smoker.   MEDICATIONS:  Plaquenil 400 mg p.o. q.h.s.   ALLERGIES:  CODEINE.   SOCIAL HISTORY:  No alcohol or drug use.  She does smoke.   FAMILY HISTORY:  Noncontributory.   PHYSICAL EXAMINATION:  VITAL SIGNS:  Temperature 101.9, heart rate 113,  now down to 104, blood pressure 124/77, respirations 20, O2 saturation  98% on room air.  GENERAL:  She is alert and oriented x3.  No apparent distress.  HEENT:  Normocephalic, atraumatic.  Mucous membranes moist.  She has no  carotid bruits.  HEART:  Regular rate and rhythm.  S1, S2.  A 2/6 systolic ejection  murmur.  LUNGS:  Clear to auscultation bilaterally.  ABDOMEN:  Soft, nontender, nondistended positive bowel sounds.  EXTREMITIES:  No cyanosis, clubbing or edema.   LABORATORY DATA:  Chest x-ray shows a right lower lobe infiltrate.  White count 2.7, H&H 7.6 and 23, MCV 71, platelets 299, lipase level 28.  UA notes large hemoglobin, small leukocyte esterase with many bacteria  but only 3-6 white cells, 7-10 red cells.  Sodium 142, potassium 3.5,  chloride 113, bicarbonate 25, BUN 15, creatinine 0.5, glucose 95.  LFTs  noted for an albumin of 2.7.   ASSESSMENT/PLAN:  1. Shortness of breath, felt to be multifactorial secondary to anemia      and pneumonia.  2. Pneumonia.  Even though she has a normal white count, I suspect      that with chest x-ray her chronic complaints and elevated      temperature, she only has an infection.  Agree with treatment with      IV antibiotics and supplemental oxygen.  3. Anemia, likely secondary to chronic disease.  She is status post      one unit transfused packed red blood cells which should improve her      oxygenation in the emergency room.  4. Lupus, continue Plaquenil.  5.  Hematuria.  Question how if this is true hematuria, given the fact      that she only had 7-10 red cells and a large amount of hemoglobin.      Will check a CPK level as well as follow up CBC.  If it is indeed      truly hematuria, then will check a renal ultrasound.      Hollice Espy, M.D.  Electronically Signed     SKK/MEDQ  D:  01/21/2007  T:  01/21/2007  Job:  [id number]   cc:   [name]. Polite, M.D.

## 2010-06-24 NOTE — Discharge Summary (Signed)
Ariel Johns, Ariel Johns               ACCOUNT NO.:  [id number]   MEDICAL RECORD NO.:  [id number]          PATIENT TYPE:  INP   LOCATION:  1409                         FACILITY:  [facility]   PHYSICIAN:  [name]. Polite, M.D. DATE OF BIRTH:  02-Feb-1986   DATE OF ADMISSION:  01/20/2007  DATE OF DISCHARGE:  01/25/2007                               DISCHARGE SUMMARY   DISCHARGE DIAGNOSIS:  1. Right lower lobe pneumonia, being discharged afebrile, at 99% on      room air, to continue 5 days of oral Avelox 400 mg daily.  2. Active lupus, to see her rheumatologist in followup.  The patient      is to continue Plaquenil 400 mg.  3. 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 The patient is be seen on outpatient      basis by nephrology for possible biopsy.  4. Anemia, multifactorial, iron deficient plus or minus chronic      disease.  The patient to continue oral iron.  Please note, she is      status post bone marrow biopsy by Dr. Dalene Carrow.  She will have      outpatient followup with hematology. Discharge hemoglobin 8.9.      Please note:  The patient is status post 2 units of packed red      blood cells this hospitalization.  5. Positive blood culture 1 out of 2 coagulase-negative Staphylococcus      consistent with contaminant.  6. Malaise, multifactorial.   PROCEDURE:  The patient underwent lumbar biopsy on December 16.   STUDIES:  1. The patient had a chest x-ray initially showing atelectasis,      questionable infiltrate, small effusion.  2. X-ray on December 16 was consistent with right lower lobe      pneumonia.  3. CBC on admission:  White count 2.5, hemoglobin 8.9, MCV 74,      platelets 215.  BMET within normal limits.   HISTORY OF PRESENT ILLNESS:  This 25 year old female presented to the  hospital with complaints of shortness of breath, intermittent fever.  ED  evaluated, and x-ray showed infiltrate. Admission was deemed necessary  for further evaluation and treatment.  Please see dictated H&P  for  further details.   PAST MEDICAL HISTORY:  Significant for:  1. Lupus erythematosus.  2. The patient has known Raynaud's.  3. Anxiety.  4. Tobacco use   DISCHARGE MEDICATIONS:  1. Plaquenil 40 mg daily.  2. Ultram 50 mg 45 tablets dispensed to be take 1 q. 6-8 h p.r.n.      pain.   ALLERGIES:  CODEINE.   SOCIAL HISTORY:  Negative for alcohol or drugs.  Positive for tobacco.   FAMILY HISTORY:  Noncontributory.   HOSPITAL COURSE:  #1.  PNEUMONIA:  The patient was admitted to a medicine floor bed for  evaluation and treatment of multiple comorbidities and also pneumonia.  The patient had serial x-rays which ultimately did show definitive  infiltrate in the right lower lobe. The patient was treated with  antibiotics, IV Rocephin, azithromycin, ultimately switched to oral  Avelox. The patient has been advised to  continue incentive spirometer.  She will be discharged home with five more days of oral Avelox.  She  will require followup by primary MD in approximately 10 days.   #2.  ANEMIA, MULTIFACTORIAL:  The patient did receive 2 units by packed  red blood cells.  She does have iron-deficiency anemia  as well.  The  patient had been seen by hematologist outpatient prior to admission and  also was seen in consultation in the hospital where she underwent bone  marrow.  Dr. Dalene Carrow will follow up results with the patient.  Patient  has been asymptomatic from her current hemoglobin.   #3.  LUPUS:  Recommend continue Plaquenil. At this time, no changes in  her treatment for lupus have been recommended.  She will follow up with  Dr. Coral Spikes on outpatient basis.   #4.  TRANSIENT HEMATURIA:  Seen in consultation by renal. Studies have  been ordered.  Most are pending at this time. She will be followed on an  outpatient basis to rule out active lupus nephritis. On December 15,  case had been discussed with Dr. Darrick Johns, decided to defer biopsy  until outpatient followup.   The  patient's other medical problems remained fairly stable at this  time.  She will be discharged to home in stable condition.      [name]. Polite, M.D.  Electronically Signed     RDP/MEDQ  D:  01/25/2007  T:  01/26/2007  Job:  [id number]   cc:   Demetria Pore. Coral Spikes, M.D.  Fax: [phone]

## 2010-06-27 NOTE — Op Note (Signed)
NAME:  Ariel Johns, Ariel Johns                         ACCOUNT NO.:  [id number]   MEDICAL RECORD NO.:  [id number]                   PATIENT TYPE:  OIB   LOCATION:  2899                                 FACILITY:  [facility]   PHYSICIAN:  Hewitt Blade, D.D.S.             DATE OF BIRTH:  26-Nov-1985   DATE OF PROCEDURE:  DATE OF DISCHARGE:  06/20/2002                                 OPERATIVE REPORT   PREOPERATIVE DIAGNOSIS:  1. Maxillary mandibular impacted third molar teeth, numbers 1, 16, 17 and     32.  2. History of lupus.  3. History of severe Raynaud's syndrome.   POSTOPERATIVE DIAGNOSIS:  1. Maxillary mandibular impacted third molar teeth, numbers 1, 16, 17 and     32.  2. History of lupus.  3. History of severe Raynaud's syndrome.   OPERATION PERFORMED:  Removal of third molar teeth numbers 1, 16, 17, and  32.   SURGEON:  Hewitt Blade, D.D.S.   ASSISTANT:  [place].   ANESTHESIA:  General via oral endotracheal intubation.   ESTIMATED BLOOD LOSS:  Less than 50mL.   FLUIDS REPLACED:  Approximately of crystalloid solution.   COMPLICATIONS:  None apparent.   INDICATIONS FOR PROCEDURE:  The patient was referred to my office for  removal of her impacted third molar teeth.  The patient had presented with  chronic pain and infection.  The patient presented with a history of lupus  and severe Raynaud's syndrome.  Even during the consultation visit, she had  significant problems with circulation in the upper extremities.  Due to the  necessities of keeping the extremities warm and her history of lupus, it was  recommended that the procedure be performed in a hospital setting where heat  could be applied continuously to the extremities.   DESCRIPTION OF PROCEDURE:  On Jun 20, 2002, the patient was taken to [facility] main operating suite where she was placed on the operating  room table in supine position.  Following successful intubation and general  anesthesia, the patient's face, neck and oral cavity were prepped and draped  in the usual sterile operating room fashion.  The hypopharynx was suctioned  free of fluids and secretions and a moistened two inch vaginal pack was  placed as a throat pack.   Attention was then directed intraorally, where approximately 10mL of 0.5%  Xylocaine containing 1:200,000 epinephrine were infiltrated in the maxillary  right and left posterior superior alveolar nerve distributions, the  corresponding palatal soft tissues, and the right and left inferior alveolar  neurovascular regions.  Attention was then directed toward the maxillary  arch, where a #15 Bard-Parker blade was used to create a 1.5 cm  mucoperiosteal incision distal to tooth #2.  The mucoperiosteal flap was  elevated laterally and superiorly using a #9 Molt periosteal elevator  exposing the cortical bone overlying tooth #1.  This bone was reduced using  a Stryker rotator osteotome with copious irrigation and suctioning.  The  tooth was identified and sectioned with a Stryker rotary osteotome and a 107  fissure bur.  The tooth was then subluxated from the alveolus and removed  using a 301 elevator and rongeurs and forceps.  Surrounding dental  follicular tissue was curetted and removed using a double-ended Molt curet  and rongeurs and forceps.  The bony margins were then smoothed and contoured  with a small osseous file.  The surgical site was then thoroughly irrigated  with sterile saline irrigating solution and suctioned.  The mucoperiosteal  margins were approximated and sutured using 4-0 chromic suture material in  an interrupted fashion.   In a similar fashion, the remaining third molar teeth were removed as well.  The oral cavity was then thoroughly irrigated and suctioned.  The throat  pack was removed and the hypopharynx suctioned free of fluids and  secretions.  The patient was allowed to awaken from the anesthesia and taken  to  the recovery room where she tolerated the procedure well without apparent  complications.                                                Hewitt Blade, D.D.S.    DC/MEDQ  D:  06/21/2002  T:  06/22/2002  Job:  [id number]

## 2010-06-27 NOTE — Op Note (Signed)
NAME:  Ariel Johns, [name]                         ACCOUNT NO.:  [id number]   MEDICAL RECORD NO.:  [id number]                   PATIENT TYPE:  AMB   LOCATION:  DSC                                  FACILITY:  [facility]   PHYSICIAN:  Leonia Corona, M.D.               DATE OF BIRTH:  1985-05-28   DATE OF PROCEDURE:  DATE OF DISCHARGE:                                 OPERATIVE REPORT   PREOPERATIVE DIAGNOSIS:  Multiple cervical lymphadenitis, rule out  sarcoidosis.   POSTOPERATIVE DIAGNOSIS:  Myocervical lymphadenitis, rule out sarcoidosis.   PROCEDURE PERFORMED:  Left cervical lymph node biopsy.   ANESTHESIA:  General laryngeal mask anesthesia.   SURGEON:  Leonia Corona, M.D.   ASSISTANT:  Nurse.   INDICATIONS FOR PROCEDURE:  This 25 year-old female was examined and  evaluated in my office for recurrent neck swelling of the parotid area which  was evaluated with a CT scan which showed multiple calcification along the  ducts and multiple lymphadenitis with a strong suspicion of a sarcoidosis;  hence the indication for the diagnostic biopsy.   PROCEDURE IN DETAIL:  The patient was brought to the operating room and  placed supine on the operating table. General laryngeal mask anesthesia was  given.  The left side of the neck was cleaned, prepped and draped after the  lower cervical lymph node was palpated and marked.  After cleaning and  draping, an incision was made along the skin crease in the lower neck on the  left side extending for about 2 to 3 cm.  The incision was deepened through  the subcutaneous tissue carefully with the help of a knife and then the  platysma was carefully nicked in one spot within the help of scissors and  the opening into the platysma was increased by stretching with a hemostat  and the pad of fat deeper to the platysma was now carefully inspected and  dissected bluntly with the help of a blunted hemostat as well as Q-Tips  until the group of lymph nodes  were identified which were carefully  dissected with Q-Tips and isolated from all sides in two big chunks of lymph  nodes each measuring about a peanut size were taken out for biopsy.  Oozing  parts were picked up fine tooth hemostats and ligated using 5-0 silk.  The  wound was irrigated and no significant bleeding was noted.  The wound was  then closed in two layers, the platysma layer using  5-0 Vicryl and the skin with 5-0 Monocryl subcuticular stitch.  Steri-Strips  were applied which was followed by a sterile gauze dressing.  The patient  tolerated the procedure very well.  The patient was extubated and  transported to the recovery room in stable condition.  Leonia Corona, M.D.    SF/MEDQ  D:  02/07/2002  T:  02/07/2002  Job:  [id number]   cc:   Vinnie Level E. Zenaida Niece, M.D.  [address]  [place]  [place] [address]  Fax: [phone]   DR. FUTEJ  RHEUMATOLOGY  Marcy Panning

## 2010-06-27 NOTE — Assessment & Plan Note (Signed)
[facility]                            CARDIOLOGY OFFICE NOTE   KATORIA, YETMAN                      MRN:          [id number]  DATE:03/08/2006                            DOB:          Sep 13, 1985    Ariel Johns is a 25 year old female with past medical history of systemic  lupus erythematosus and Raynaud's who we were asked to evaluate for  chest pain.  The patient has no prior cardiac history.  In early January  she developed a tightness in her left chest and left axilla.  The pain  is described as someone sitting on my chest.  It increases with lying  flat and also with deep inspiration.  It also increases with stress by  her report.  It is not clearly exertional nor is it related to food.  It  can last anywhere from 15 minutes at a time to 20 minutes at a time.  It  does improve with sitting up.  She did go to the emergency room and was  seen on February 23, 2006.  She had an elevated D-dimer and a CT scan was  therefore performed.  It showed no evidence of pulmonary embolism.  There was a small left-sided pleural effusion and trace pericardial  fluid was noted.  There was thoracic adenopathy noted as well.  She also  had laboratories performed that showed a sodium of 136 and a potassium  of 3.6.  Her hemoglobin and hematocrit were 40 and 13.6 respectively.  Because of her chest pain we were asked to further evaluate.  She has an  allergy to CODEINE.  She takes Plaquenil 200 mg tablets two p.o. daily  and prednisone 10 mg tablets four p.o. daily.   SOCIAL HISTORY:  She does smoke and only rarely consumes alcohol.  She  denies any drug use.   FAMILY HISTORY:  Positive for coronary artery disease in her mother, who  apparently has angina.   PAST MEDICAL HISTORY:  There is no diabetes mellitus, hypertension or  hyperlipidemia.  She does have a history of systemic lupus  erythematosus.  She also has Raynaud's.  She has had a previous lymph  node  removed from her left shoulder to make the diagnosis of SLE.  There  is no other past medical history noted.   REVIEW OF SYSTEMS:  She denies any headaches at present and there are no  fevers or chills or productive cough.  There is no dysphagia,  odynophagia, melena or hematochezia.  There is dysuria or hematuria.  There is no rash or seizure activity.  There is no orthopnea, PND or  pedal edema.  The remaining systems are negative.   PHYSICAL EXAMINATION:  VITAL SIGNS:  Shows a blood pressure of 102/64  and her pulse is 82.  She weighs 160 pounds.  GENERAL:  She is well-developed and well-nourished in no acute distress.  Her skin is warm and dry.  She does not appear to be depressed and there  is no peripheral clubbing.  Her back is normal.  I cannot appreciate  adenopathy.  HEENT:  Unremarkable with normal eyelids.  NECK:  Supple with a normal upstroke bilaterally.  There is no jugular  venous distention and I cannot appreciate thyromegaly.  CHEST:  Clear to auscultation with normal expansion.  CARDIOVASCULAR:  Reveals a regular rate and rhythm, normal S1 and S2.  There are no murmurs, rubs or gallops noted. Her PMI is nondisplaced.  Note she has 2+ radial pulses bilaterally.  However, she does have  bluish discoloration of her distal digits (first and second) on her  hands bilaterally.  ABDOMEN:  Not tender or distended, positive bowel sounds.  No  hepatosplenomegaly, no mass appreciated.  There no abdominal bruit.  She  has 2+ femoral pulses bilaterally and no bruits.  EXTREMITIES:  Show no edema and I can palpate no cords.  She has 2+  dorsalis pedis pulse on the left and 1+ on the right.  NEUROLOGIC:  Grossly intact.   Her electrocardiogram shows a sinus rhythm at a rate of 82.  There are  no significant ST changes noted.  There are small nondiagnostic Q waves  in the inferior leads.   DIAGNOSES:  1. Chest pain, possibly secondary to musculoskeletal pain or       pericarditis.  2. Systemic lupus erythematosus.  3. Raynaud's.  4. Tobacco abuse.   PLAN:  Ariel Johns presents for evaluation of chest pain of uncertain  etiology.  There is some reproduction of her pain with palpating her  left upper chest.  However, it does worsen when she lies flat and also  with deep inspiration.  Her recent CT scan showed no evidence of  pulmonary embolus.  She certainly could have a pleuropericarditis.  However, this should be treated adequately with steroids.  I have  instructed her that she may take Motrin 600 mg p.o. t.i.d. with food for  3 days to see if this improves her symptoms.  We will also schedule her  to have an echocardiogram to rule out pericardial effusion.  Otherwise,  I do not think we need to pursue further cardiac workup.  I have  instructed her on the importance of discontinuing her tobacco use,  particularly in light of her Raynaud's.  She will follow up with Dr.  Kellie Simmering concerning her adenopathy.  I will see her back on an as-needed  basis.     Madolyn Frieze Jens Som, MD, [facility]  Electronically Signed    BSC/MedQ  DD: 03/08/2006  DT: 03/08/2006  Job #: [id number]   cc:   Aundra Dubin, M.D.

## 2010-06-27 NOTE — Discharge Summary (Signed)
NAMECARMELINE, Ariel Johns          ACCOUNT NO.:  [id number]   MEDICAL RECORD NO.:  [id number]          PATIENT TYPE:  INP   LOCATION:  9142                          FACILITY:  [facility]   PHYSICIAN:  Kendra H. Tenny Craw, MD     DATE OF BIRTH:  04-18-1985   DATE OF ADMISSION:  09/10/2008  DATE OF DISCHARGE:  09/13/2008                               DISCHARGE SUMMARY   FINAL DIAGNOSES:  Intrauterine pregnancy at 33 weeks' gestation,  oligohydramnios, spontaneous variable decelerations failed induction.   PROCEDURE:  Primary low-transverse cesarean section.   SURGEON:  Gerrit Friends. Aldona Bar, MD   COMPLICATIONS:  None.   This 25 year old, G1, P0 presents on the afternoon of August 2,  complaining of decreased fetal movement.  The patient had a reactive  NST, but there were some variable decelerations noted.  At this point,  an ultrasound was performed which did show a low amniotic fluid volume  of 6.  NST was reactive, but informal consultation with Dr. Margot Ables  resulted in admission for an induction of labor.  Otherwise, the  patient's antepartum course up to this point had been complicated by the  patient's history of lupus which was in good control during pregnancy.  Otherwise, there was no complications during her pregnancy.  She is  positive for group B strep.  The patient was admitted for induction  secondary to this oligohydramnios with some variable decelerations noted  on NST.  The patient was started on penicillin per protocol for positive  group B strep status.  The patient had a dose of Cytotec placed.  Dr.  Aldona Bar was called at 7:00 p.m. because of an impressive deceleration,  where the fetal heart rate dropped to the 60s for about 1-1/2-2 minutes.  There was recovery of position change and the strip after that was  reactive.  At this point, a discussion was held with the patient and her  husband and decision was made to proceed with a cesarean section  secondary to this fetal intolerance  of labor.  The patient was taken to  the operating room on September 10, 2008, by Dr. Annamaria Helling, where a  primary low-transverse cesarean section was performed with the delivery  of a 6 pounds 10 ounces female infant with Apgars of 8 and 9.  Delivery  went without complications.  The patient's postoperative course was  benign without any significant fevers.  The patient was felt ready for  discharge on postoperative day #3.  She was sent home on a regular diet,  told to decrease activities, told to continue her prenatal vitamins, was  given a prescription for Darvocet-N 100 one to two every 4-6 hours as  needed for pain and was to follow up in our office in 4 weeks.  Instructions and precautions were reviewed with the patient.   LABORATORY ON DISCHARGE:  The patient had a hemoglobin of 9.3, white  blood cell count of 5.8, and platelets of 156,000.  Baby was Rh negative  and therefore mother did not receive RhoGAM.      Leilani Able, P.A.-C.      Freddrick March. Tenny Craw,  MD  Electronically Signed    MB/MEDQ  D:  10/01/2008  T:  10/02/2008  Job:  [id number]

## 2010-10-29 LAB — COMPREHENSIVE METABOLIC PANEL
AST: 23
BUN: 20
CO2: 26
Calcium: 9.2
Creatinine, Ser: 0.67
GFR calc Af Amer: >60
GFR calc non Af Amer: >60

## 2010-10-29 LAB — CROSSMATCH: Antibody Screen: NEGATIVE

## 2010-10-29 LAB — RENAL FUNCTION PANEL
CO2: 26
Calcium: 8.6
Chloride: 104
GFR calc Af Amer: >60
GFR calc non Af Amer: >60
Sodium: 137

## 2010-10-29 LAB — CBC
MCHC: 33.1
MCHC: 33.3
MCV: 75.3 — ABNORMAL LOW
Platelets: 223
RBC: 3.51 — ABNORMAL LOW
RBC: 3.86 — ABNORMAL LOW
WBC: 3.5 — ABNORMAL LOW
WBC: 4.8

## 2010-10-29 LAB — HEMOGLOBIN AND HEMATOCRIT, BLOOD
HCT: 25.8 — ABNORMAL LOW
Hemoglobin: 8.2 — ABNORMAL LOW

## 2010-10-29 LAB — DIFFERENTIAL
Eosinophils Relative: 0
Lymphocytes Relative: 22
Lymphs Abs: 1
Neutro Abs: 3.2
Neutrophils Relative %: 73

## 2010-10-29 LAB — ABO/RH: ABO/RH(D): A NEG

## 2010-10-31 LAB — DIFFERENTIAL
Basophils Absolute: 0
Basophils Relative: 0
Eosinophils Absolute: 0.1
Eosinophils Relative: 2
Monocytes Absolute: 0.3
Neutro Abs: 2.7

## 2010-10-31 LAB — COMPREHENSIVE METABOLIC PANEL
ALT: 16
AST: 20
Albumin: 2.9 — ABNORMAL LOW
Alkaline Phosphatase: 45
BUN: 17
Chloride: 107
Potassium: 3.6
Sodium: 142
Total Bilirubin: 0.5

## 2010-10-31 LAB — CBC
HCT: 34.4 — ABNORMAL LOW
Platelets: 249
WBC: 3.8 — ABNORMAL LOW

## 2010-11-14 LAB — BASIC METABOLIC PANEL
BUN: 6
CO2: 26
Calcium: 7.9 — ABNORMAL LOW
Chloride: 109
Creatinine, Ser: 0.42
GFR calc Af Amer: >60
GFR calc non Af Amer: >60
Glucose, Bld: 71
Potassium: 4.1
Sodium: 139

## 2010-11-14 LAB — ANTI-DNA ANTIBODY, DOUBLE-STRANDED: ds DNA Ab: >300 IU/mL — ABNORMAL HIGH (ref <5)

## 2010-11-14 LAB — CBC
MCHC: 33.3
Platelets: 215
RDW: 19.4 — ABNORMAL HIGH

## 2010-11-14 LAB — CHROMOSOME ANALYSIS, BONE MARROW

## 2010-11-17 LAB — URINALYSIS, ROUTINE W REFLEX MICROSCOPIC
Bilirubin Urine: NEGATIVE
Glucose, UA: NEGATIVE
Glucose, UA: NEGATIVE
Ketones, ur: NEGATIVE
Ketones, ur: NEGATIVE
Nitrite: NEGATIVE
Protein, ur: 100 — AB
Protein, ur: NEGATIVE
Urobilinogen, UA: 0.2

## 2010-11-17 LAB — CBC
HCT: 22.6 — ABNORMAL LOW
HCT: 23.3 — ABNORMAL LOW
Hemoglobin: 7.6 — CL
Hemoglobin: 7.8 — CL
MCHC: 33.5
Platelets: 241
RBC: 3.18 — ABNORMAL LOW
RBC: 3.83 — ABNORMAL LOW
RDW: 18 — ABNORMAL HIGH
RDW: 18 — ABNORMAL HIGH
WBC: 2.7 — ABNORMAL LOW

## 2010-11-17 LAB — COMPREHENSIVE METABOLIC PANEL
ALT: 14
Alkaline Phosphatase: 48
BUN: 15
CO2: 25
GFR calc non Af Amer: >60
Glucose, Bld: 95
Potassium: 3.5
Sodium: 143
Total Bilirubin: 0.3
Total Protein: 6.4

## 2010-11-17 LAB — CREATININE, URINE, 24 HOUR: Creatinine, Urine: 67.5

## 2010-11-17 LAB — CULTURE, BLOOD (ROUTINE X 2)

## 2010-11-17 LAB — RENAL FUNCTION PANEL
Albumin: 2.6 — ABNORMAL LOW
BUN: 12
Calcium: 7.6 — ABNORMAL LOW
Chloride: 110
Creatinine, Ser: 0.64
Phosphorus: 3.3

## 2010-11-17 LAB — PROTEIN, URINE, 24 HOUR
Collection Interval-UPROT: 24
Protein, 24H Urine: 696 — ABNORMAL HIGH

## 2010-11-17 LAB — ABO/RH: ABO/RH(D): A NEG

## 2010-11-17 LAB — IRON AND TIBC
Iron: 24 — ABNORMAL LOW
Saturation Ratios: 11 — ABNORMAL LOW
TIBC: 215 — ABNORMAL LOW
UIBC: 191

## 2010-11-17 LAB — DIFFERENTIAL
Basophils Absolute: 0
Eosinophils Absolute: 0 — ABNORMAL LOW
Lymphocytes Relative: 20
Monocytes Absolute: 0.1
Neutrophils Relative %: 77

## 2010-11-17 LAB — TYPE AND SCREEN
ABO/RH(D): A NEG
Antibody Screen: NEGATIVE

## 2010-11-17 LAB — URINE MICROSCOPIC-ADD ON

## 2010-11-17 LAB — ANTI-NEUTROPHIL ANTIBODY: Cytoplasmic Neutrophilic Ab: <1:20 {titer}

## 2010-11-17 LAB — C3 COMPLEMENT: C3 Complement: 29 — ABNORMAL LOW

## 2010-11-17 LAB — LIPASE, BLOOD: Lipase: 28

## 2010-11-17 LAB — PREPARE RBC (CROSSMATCH)

## 2010-11-17 LAB — FERRITIN: Ferritin: 492 — ABNORMAL HIGH (ref 10–291)

## 2010-11-17 LAB — C4 COMPLEMENT: Complement C4, Body Fluid: 4 — ABNORMAL LOW

## 2010-11-20 LAB — CREATININE, SERUM: GFR calc Af Amer: >60

## 2011-01-19 ENCOUNTER — Other Ambulatory Visit: Payer: Self-pay | Admitting: Nephrology

## 2011-06-17 ENCOUNTER — Ambulatory Visit (HOSPITAL_COMMUNITY)
Admission: RE | Admit: 2011-06-17 | Discharge: 2011-06-17 | Disposition: A | Discharge status: Home / Self Care | Payer: BC Managed Care – PPO | Source: Ambulatory Visit | Attending: Nephrology | Admitting: Nephrology

## 2011-06-17 DIAGNOSIS — D509 Iron deficiency anemia, unspecified: Secondary | ICD-10-CM | POA: Insufficient documentation

## 2011-06-17 MED ORDER — FERUMOXYTOL INJECTION 510 MG/17 ML
510.0000 mg | Freq: Once | INTRAVENOUS | Status: AC
  Administered 2011-06-17: 510 mg via INTRAVENOUS
  Filled 2011-06-17: qty 17

## 2011-06-17 MED ORDER — SODIUM CHLORIDE 0.9 % IV SOLN
Freq: Once | INTRAVENOUS | Status: AC
  Administered 2011-06-17: 11:00:00 via INTRAVENOUS

## 2013-10-09 ENCOUNTER — Other Ambulatory Visit (HOSPITAL_COMMUNITY): Payer: Self-pay | Admitting: Nephrology

## 2013-10-09 DIAGNOSIS — R809 Proteinuria, unspecified: Secondary | ICD-10-CM

## 2013-10-10 ENCOUNTER — Encounter (HOSPITAL_COMMUNITY)
Admission: RE | Admit: 2013-10-10 | Discharge: 2013-10-10 | Disposition: A | Discharge status: Home / Self Care | Payer: BC Managed Care – PPO | Source: Ambulatory Visit | Attending: Nephrology | Admitting: Nephrology

## 2013-10-10 DIAGNOSIS — Z79899 Other long term (current) drug therapy: Secondary | ICD-10-CM | POA: Diagnosis not present

## 2013-10-10 DIAGNOSIS — Z881 Allergy status to other antibiotic agents status: Secondary | ICD-10-CM | POA: Diagnosis not present

## 2013-10-10 DIAGNOSIS — I129 Hypertensive chronic kidney disease with stage 1 through stage 4 chronic kidney disease, or unspecified chronic kidney disease: Secondary | ICD-10-CM | POA: Diagnosis not present

## 2013-10-10 DIAGNOSIS — N182 Chronic kidney disease, stage 2 (mild): Secondary | ICD-10-CM | POA: Diagnosis not present

## 2013-10-10 DIAGNOSIS — M329 Systemic lupus erythematosus, unspecified: Secondary | ICD-10-CM | POA: Diagnosis not present

## 2013-10-10 DIAGNOSIS — D631 Anemia in chronic kidney disease: Secondary | ICD-10-CM | POA: Diagnosis not present

## 2013-10-10 DIAGNOSIS — R609 Edema, unspecified: Secondary | ICD-10-CM | POA: Diagnosis present

## 2013-10-10 DIAGNOSIS — Z885 Allergy status to narcotic agent status: Secondary | ICD-10-CM | POA: Diagnosis not present

## 2013-10-10 DIAGNOSIS — N049 Nephrotic syndrome with unspecified morphologic changes: Secondary | ICD-10-CM | POA: Diagnosis not present

## 2013-10-10 LAB — CBC WITH DIFFERENTIAL/PLATELET
BASOS ABS: 0 10*3/uL (ref 0.0–0.1)
Basophils Relative: 0 % (ref 0–1)
Eosinophils Absolute: 0 10*3/uL (ref 0.0–0.7)
Eosinophils Relative: 0 % (ref 0–5)
HEMATOCRIT: 29.2 % — AB (ref 36.0–46.0)
Hemoglobin: 9.7 g/dL — ABNORMAL LOW (ref 12.0–15.0)
LYMPHS PCT: 8 % — AB (ref 12–46)
Lymphs Abs: 0.9 10*3/uL (ref 0.7–4.0)
MCH: 26.9 pg (ref 26.0–34.0)
MCHC: 33.2 g/dL (ref 30.0–36.0)
MCV: 80.9 fL (ref 78.0–100.0)
MONO ABS: 0.6 10*3/uL (ref 0.1–1.0)
Monocytes Relative: 5 % (ref 3–12)
NEUTROS ABS: 10.4 10*3/uL — AB (ref 1.7–7.7)
Neutrophils Relative %: 87 % — ABNORMAL HIGH (ref 43–77)
Platelets: 350 10*3/uL (ref 150–400)
RBC: 3.61 MIL/uL — ABNORMAL LOW (ref 3.87–5.11)
RDW: 13.6 % (ref 11.5–15.5)
WBC: 11.9 10*3/uL — AB (ref 4.0–10.5)

## 2013-10-10 LAB — COMPREHENSIVE METABOLIC PANEL
ALBUMIN: 1.7 g/dL — AB (ref 3.5–5.2)
ALT: 16 U/L (ref 0–35)
AST: 17 U/L (ref 0–37)
Alkaline Phosphatase: 54 U/L (ref 39–117)
Anion gap: 13 (ref 5–15)
BUN: 54 mg/dL — ABNORMAL HIGH (ref 6–23)
CALCIUM: 8.8 mg/dL (ref 8.4–10.5)
CHLORIDE: 102 meq/L (ref 96–112)
CO2: 24 meq/L (ref 19–32)
CREATININE: 1.67 mg/dL — AB (ref 0.50–1.10)
GFR calc Af Amer: 47 mL/min — ABNORMAL LOW (ref >90)
GFR, EST NON AFRICAN AMERICAN: 41 mL/min — AB (ref >90)
Glucose, Bld: 116 mg/dL — ABNORMAL HIGH (ref 70–99)
Potassium: 3.8 mEq/L (ref 3.7–5.3)
SODIUM: 139 meq/L (ref 137–147)
Total Bilirubin: <0.2 mg/dL — ABNORMAL LOW (ref 0.3–1.2)
Total Protein: 4.3 g/dL — ABNORMAL LOW (ref 6.0–8.3)

## 2013-10-10 LAB — PROTIME-INR
INR: 0.92 (ref 0.00–1.49)
PROTHROMBIN TIME: 12.4 s (ref 11.6–15.2)

## 2013-10-10 LAB — APTT: aPTT: 26 seconds (ref 24–37)

## 2013-10-10 LAB — PLATELET FUNCTION ASSAY: Collagen / Epinephrine: 100 seconds (ref 0–184)

## 2013-10-11 ENCOUNTER — Other Ambulatory Visit: Payer: Self-pay | Admitting: Radiology

## 2013-10-12 ENCOUNTER — Observation Stay (HOSPITAL_COMMUNITY)
Admission: RE | Admit: 2013-10-12 | Discharge: 2013-10-13 | Disposition: A | Discharge status: Home / Self Care | Payer: BC Managed Care – PPO | Source: Ambulatory Visit | Attending: Nephrology | Admitting: Nephrology

## 2013-10-12 VITALS — BP 140/86 | HR 74 | Temp 97.6°F | Resp 18 | Ht 66.0 in | Wt 210.5 lb

## 2013-10-12 DIAGNOSIS — I129 Hypertensive chronic kidney disease with stage 1 through stage 4 chronic kidney disease, or unspecified chronic kidney disease: Secondary | ICD-10-CM | POA: Insufficient documentation

## 2013-10-12 DIAGNOSIS — D631 Anemia in chronic kidney disease: Secondary | ICD-10-CM | POA: Insufficient documentation

## 2013-10-12 DIAGNOSIS — Z885 Allergy status to narcotic agent status: Secondary | ICD-10-CM | POA: Insufficient documentation

## 2013-10-12 DIAGNOSIS — M329 Systemic lupus erythematosus, unspecified: Secondary | ICD-10-CM | POA: Diagnosis not present

## 2013-10-12 DIAGNOSIS — R809 Proteinuria, unspecified: Secondary | ICD-10-CM

## 2013-10-12 DIAGNOSIS — N182 Chronic kidney disease, stage 2 (mild): Secondary | ICD-10-CM | POA: Insufficient documentation

## 2013-10-12 DIAGNOSIS — N049 Nephrotic syndrome with unspecified morphologic changes: Secondary | ICD-10-CM | POA: Diagnosis present

## 2013-10-12 DIAGNOSIS — N039 Chronic nephritic syndrome with unspecified morphologic changes: Secondary | ICD-10-CM

## 2013-10-12 DIAGNOSIS — Z881 Allergy status to other antibiotic agents status: Secondary | ICD-10-CM | POA: Insufficient documentation

## 2013-10-12 DIAGNOSIS — Z79899 Other long term (current) drug therapy: Secondary | ICD-10-CM | POA: Insufficient documentation

## 2013-10-12 LAB — CBC
HCT: 27.6 % — ABNORMAL LOW (ref 36.0–46.0)
HEMOGLOBIN: 9.1 g/dL — AB (ref 12.0–15.0)
MCH: 26.4 pg (ref 26.0–34.0)
MCHC: 33 g/dL (ref 30.0–36.0)
MCV: 80 fL (ref 78.0–100.0)
Platelets: 273 10*3/uL (ref 150–400)
RBC: 3.45 MIL/uL — ABNORMAL LOW (ref 3.87–5.11)
RDW: 13.6 % (ref 11.5–15.5)
WBC: 9 10*3/uL (ref 4.0–10.5)

## 2013-10-12 MED ORDER — LIDOCAINE HCL (PF) 1 % IJ SOLN
INTRAMUSCULAR | Status: AC
  Filled 2013-10-12: qty 10

## 2013-10-12 MED ORDER — SODIUM CHLORIDE 0.9 % IV SOLN
INTRAVENOUS | Status: DC
  Administered 2013-10-12: 13:00:00 via INTRAVENOUS

## 2013-10-12 MED ORDER — SORBITOL 70 % SOLN: 30.0000 mL | Status: DC | PRN

## 2013-10-12 MED ORDER — MUPIROCIN 2 % EX OINT
1.0000 "application " | TOPICAL_OINTMENT | Freq: Two times a day (BID) | CUTANEOUS | Status: DC
  Administered 2013-10-12: 1 via TOPICAL
  Filled 2013-10-12 (×2): qty 22

## 2013-10-12 MED ORDER — CALCIUM CARBONATE 1250 MG/5ML PO SUSP: 500.0000 mg | Freq: Four times a day (QID) | ORAL | Status: DC | PRN

## 2013-10-12 MED ORDER — ZOLPIDEM TARTRATE 5 MG PO TABS: 5.0000 mg | ORAL_TABLET | Freq: Every evening | ORAL | Status: DC | PRN

## 2013-10-12 MED ORDER — ONDANSETRON HCL 4 MG PO TABS: 4.0000 mg | ORAL_TABLET | Freq: Four times a day (QID) | ORAL | Status: DC | PRN

## 2013-10-12 MED ORDER — NEPRO/CARBSTEADY PO LIQD: 237.0000 mL | Freq: Three times a day (TID) | ORAL | Status: DC | PRN

## 2013-10-12 MED ORDER — ACETAMINOPHEN 325 MG PO TABS: 650.0000 mg | ORAL_TABLET | Freq: Four times a day (QID) | ORAL | Status: DC | PRN

## 2013-10-12 MED ORDER — HYDROXYCHLOROQUINE SULFATE 200 MG PO TABS
200.0000 mg | ORAL_TABLET | Freq: Two times a day (BID) | ORAL | Status: DC
  Administered 2013-10-12 – 2013-10-13 (×2): 200 mg via ORAL
  Filled 2013-10-12 (×4): qty 1

## 2013-10-12 MED ORDER — PREDNISONE 10 MG PO TABS
20.0000 mg | ORAL_TABLET | Freq: Two times a day (BID) | ORAL | Status: DC
  Administered 2013-10-12 – 2013-10-13 (×2): 20 mg via ORAL
  Filled 2013-10-12 (×4): qty 2

## 2013-10-12 MED ORDER — CLINDAMYCIN HCL 300 MG PO CAPS
300.0000 mg | ORAL_CAPSULE | Freq: Two times a day (BID) | ORAL | Status: DC
  Administered 2013-10-12 – 2013-10-13 (×2): 300 mg via ORAL
  Filled 2013-10-12 (×4): qty 1

## 2013-10-12 MED ORDER — FAMOTIDINE 20 MG PO TABS
20.0000 mg | ORAL_TABLET | Freq: Every day | ORAL | Status: DC
  Administered 2013-10-13: 20 mg via ORAL
  Filled 2013-10-12: qty 1

## 2013-10-12 MED ORDER — MYCOPHENOLATE SODIUM 180 MG PO TBEC
180.0000 mg | DELAYED_RELEASE_TABLET | Freq: Two times a day (BID) | ORAL | Status: DC
  Administered 2013-10-12 – 2013-10-13 (×2): 180 mg via ORAL
  Filled 2013-10-12 (×4): qty 1

## 2013-10-12 MED ORDER — IRBESARTAN 300 MG PO TABS
300.0000 mg | ORAL_TABLET | Freq: Every day | ORAL | Status: DC
  Administered 2013-10-13: 300 mg via ORAL
  Filled 2013-10-12: qty 1

## 2013-10-12 MED ORDER — PREDNISONE 10 MG PO TABS: 10.0000 mg | ORAL_TABLET | Freq: Two times a day (BID) | ORAL | Status: DC

## 2013-10-12 MED ORDER — CLONIDINE HCL 0.1 MG PO TABS
0.1000 mg | ORAL_TABLET | Freq: Two times a day (BID) | ORAL | Status: DC
  Administered 2013-10-12 – 2013-10-13 (×2): 0.1 mg via ORAL
  Filled 2013-10-12 (×4): qty 1

## 2013-10-12 MED ORDER — ONDANSETRON HCL 4 MG/2ML IJ SOLN: 4.0000 mg | Freq: Four times a day (QID) | INTRAMUSCULAR | Status: DC | PRN

## 2013-10-12 MED ORDER — ACETAMINOPHEN 650 MG RE SUPP: 650.0000 mg | Freq: Four times a day (QID) | RECTAL | Status: DC | PRN

## 2013-10-12 MED ORDER — FUROSEMIDE 80 MG PO TABS
80.0000 mg | ORAL_TABLET | Freq: Three times a day (TID) | ORAL | Status: DC
  Administered 2013-10-12 – 2013-10-13 (×4): 80 mg via ORAL
  Filled 2013-10-12 (×6): qty 1

## 2013-10-12 MED ORDER — CAMPHOR-MENTHOL 0.5-0.5 % EX LOTN: 1.0000 "application " | TOPICAL_LOTION | Freq: Three times a day (TID) | CUTANEOUS | Status: DC | PRN

## 2013-10-12 MED ORDER — HYDROXYZINE HCL 25 MG PO TABS: 25.0000 mg | ORAL_TABLET | Freq: Three times a day (TID) | ORAL | Status: DC | PRN

## 2013-10-12 MED ORDER — LORAZEPAM 1 MG PO TABS
2.0000 mg | ORAL_TABLET | Freq: Once | ORAL | Status: AC
  Administered 2013-10-12: 2 mg via ORAL
  Filled 2013-10-12: qty 2

## 2013-10-12 MED ORDER — DOCUSATE SODIUM 283 MG RE ENEM: 1.0000 | ENEMA | RECTAL | Status: DC | PRN

## 2013-10-12 NOTE — H&P (Signed)
Ariel Johns is an 28 y.o. female.  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 24 hour urine protein of 6.4 grams, albumin 2.1, C3 44, C4 of 8, Scr 1.81 and is being admitted for 23 hour observation for renal biopsy.  No past medical history on file.  Allergies:  Allergies   Allergen  Reactions   .  Codeine  Hives   .  Amoxicillin  Rash and Other (See Comments)     Sick on stomach    Medications: {medication reviewed/display:3041432  No results found for this or any previous visit (from the past 48 hour(s)).  No results found.  Blood pressure 165/97, pulse 69, temperature 97.6 F (36.4 C), temperature source Oral, resp. rate 18, height  (1.676 m), weight 95.255 kg (210 lb), last menstrual period 09/28/2013, SpO2 99.00%.  General appearance: alert, cooperative and no distress  Neck: no adenopathy, no carotid bruit, no JVD, supple, symmetrical, trachea midline and thyroid not enlarged, symmetric, no tenderness/mass/nodules  Resp: clear to auscultation bilaterally  Cardio: regular rate and rhythm and no rub  GI: soft, non-tender; bowel sounds normal; no masses, no organomegaly  Extremities: 3+ edema  Assessment/Plan:  1. Nephrotic syndrome- plan for renal biopsy, cont with lasix and ARB 2. HTN- increase lasix to  tid, cont micardis , clonidine 0.1 bid, and add diltiazem for antiproteinuric effect and BP 3. SLE- cont with prednisone and cellcept for now. 4. Anemia- cont to follow post biopsy. Ariel Johns A  10/12/2013, 2:28 PM

## 2013-10-12 NOTE — Progress Notes (Signed)
Pt has an order for TED stockings on both legs as VTE. After primary RN tried to put large-sized TED hose on the pt's legs, pt verbalized/stated that "these are too uncomfortable and my legs are hurting". Education about TED stockings was given to the pt but still refused to have them on. TED stockings were taken off per pt's request. Will continue to monitor.

## 2013-10-12 NOTE — H&P (Signed)
Ariel Johns is an 28 y.o. female.  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 24 hour urine protein of 6.4 grams, albumin 2.1, C3 44, C4 of 8, Scr 1.81 and is being admitted for 23 hour observation for renal biopsy.  No past medical history on file.  Allergies:  Allergies   Allergen  Reactions   .  Codeine  Hives   .  Amoxicillin  Rash and Other (See Comments)     Sick on stomach    Medications: {medication reviewed/display:[id number]  No results found for this or any previous visit (from the past 48 hour(s)).  No results found.  Blood pressure 165/97, pulse 69, temperature 97.6 F (36.4 C), temperature source Oral, resp. rate 18, height 5' 6" (1.676 m), weight 95.255 kg (210 lb), last menstrual period 09/28/2013, SpO2 99.00%.  General appearance: alert, cooperative and no distress  Neck: no adenopathy, no carotid bruit, no JVD, supple, symmetrical, trachea midline and thyroid not enlarged, symmetric, no tenderness/mass/nodules  Resp: clear to auscultation bilaterally  Cardio: regular rate and rhythm and no rub  GI: soft, non-tender; bowel sounds normal; no masses, no organomegaly  Extremities: 3+ edema  Assessment/Plan:  1. Nephrotic syndrome- plan for renal biopsy, cont with lasix and ARB 2. HTN- increase lasix to 80mg tid, cont micardis 80mg, clonidine 0.1 bid, and add diltiazem for antiproteinuric effect and BP 3. SLE- cont with prednisone and cellcept for now. 4. Anemia- cont to follow post biopsy. Ariel Johns A  10/12/2013, 2:28 PM      

## 2013-10-12 NOTE — Progress Notes (Signed)
Admission note:  Arrival Method: stretcher Mental Orientation: alert & oriented x 4 Telemetry: no ordered  Assessment: completed Skin: wound size 2.5cm x 2cm on right lower shin area  WU:JWJX hand saline locked  Pain: pt denies pain  Tubes: N/A Safety Measures: Patient Handbook has been given, and discussed the Fall Prevention worksheet. Left at bedside  Admission: Completed and admission orders have been written  6E Orientation: Patient has been oriented to the unit, staff and to the room.  Family: At the bedside- Mother

## 2013-10-12 NOTE — Progress Notes (Signed)
Report to 6E RN 

## 2013-10-13 DIAGNOSIS — M329 Systemic lupus erythematosus, unspecified: Secondary | ICD-10-CM | POA: Diagnosis not present

## 2013-10-13 LAB — CBC
HCT: 27.9 % — ABNORMAL LOW (ref 36.0–46.0)
Hemoglobin: 9.2 g/dL — ABNORMAL LOW (ref 12.0–15.0)
MCH: 26.4 pg (ref 26.0–34.0)
MCHC: 33 g/dL (ref 30.0–36.0)
MCV: 80.2 fL (ref 78.0–100.0)
PLATELETS: 255 10*3/uL (ref 150–400)
RBC: 3.48 MIL/uL — ABNORMAL LOW (ref 3.87–5.11)
RDW: 13.7 % (ref 11.5–15.5)
WBC: 8.6 10*3/uL (ref 4.0–10.5)

## 2013-10-13 LAB — COMPREHENSIVE METABOLIC PANEL
ALBUMIN: 1.4 g/dL — AB (ref 3.5–5.2)
ALT: 12 U/L (ref 0–35)
AST: 12 U/L (ref 0–37)
Alkaline Phosphatase: 39 U/L (ref 39–117)
Anion gap: 10 (ref 5–15)
BUN: 54 mg/dL — ABNORMAL HIGH (ref 6–23)
CALCIUM: 7.8 mg/dL — AB (ref 8.4–10.5)
CHLORIDE: 105 meq/L (ref 96–112)
CO2: 24 mEq/L (ref 19–32)
CREATININE: 1.5 mg/dL — AB (ref 0.50–1.10)
GFR calc Af Amer: 53 mL/min — ABNORMAL LOW (ref >90)
GFR calc non Af Amer: 46 mL/min — ABNORMAL LOW (ref >90)
Glucose, Bld: 120 mg/dL — ABNORMAL HIGH (ref 70–99)
Potassium: 4.2 mEq/L (ref 3.7–5.3)
SODIUM: 139 meq/L (ref 137–147)
Total Bilirubin: <0.2 mg/dL — ABNORMAL LOW (ref 0.3–1.2)
Total Protein: 3.9 g/dL — ABNORMAL LOW (ref 6.0–8.3)

## 2013-10-13 LAB — BLOOD PRODUCT ORDER (VERBAL) VERIFICATION

## 2013-10-13 MED ORDER — MYCOPHENOLATE SODIUM 180 MG PO TBEC: 180.0000 mg | DELAYED_RELEASE_TABLET | Freq: Two times a day (BID) | ORAL | Status: DC

## 2013-10-13 NOTE — Progress Notes (Signed)
UR Completed.  Leara Rawl Jane [phone] 10/13/2013  

## 2013-10-13 NOTE — Discharge Instructions (Signed)
Kidney Biopsy °A biopsy is a test that involves collecting small pieces of tissue, usually with a needle. The tissue is then examined under a microscope. A kidney biopsy can help a health care provider make a diagnosis and determine the best course of treatment. Your health care provider may recommend a kidney biopsy if you have any of the following conditions: °· Blood in your urine (hematuria). °· Excessive protein in your urine (proteinuria). °· Impaired kidney function that causes excessive waste products in your blood. °A specialist will look at the kidney tissue samples to check for unusual deposits, scarring, or infecting organisms that would explain your condition. If you have a kidney transplant, a biopsy can also help explain why a transplanted kidney is not working properly. Talk with your health care provider about what information might be learned from the biopsy and the risks involved. This can help you make a decision about whether a biopsy is worthwhile in your case. °LET YOUR HEALTH CARE PROVIDER KNOW ABOUT: °· Any allergies you have. °· All medicines you are taking, including vitamins, herbs, eye drops, creams, and over-the-counter medicines. °· Previous problems you or members of your family have had with the use of anesthetics. °· Any blood disorders you have. °· Previous surgeries you have had. °· Medical conditions you have. °RISKS AND COMPLICATIONS °Generally, a kidney biopsy is a safe procedure. However, as with any procedure, complications can occur. Possible complications include: °· Infection. °· Bleeding. °BEFORE THE PROCEDURE °· Make sure you understand the need for a biopsy. °· Do not eat or drink for 8 hours before the test or as directed by your health care provider. °· You will need to give blood and urine samples before the biopsy. This is to make sure you do not have a condition where you should not have a biopsy. °PROCEDURE °Kidney biopsies are usually done in a hospital. During  the procedure, you may be fully awake with light sedation, or you may be asleep under general anesthesia. The entire procedure usually takes an hour. °· You will lie on your stomach to position the kidneys near the surface of your back. If you have a transplanted kidney, you will lie on your back. °· The health care provider will inject a local painkiller. For a through-the-skin (percutaneous) biopsy, the health care provider will use a locating needle and X-ray or ultrasound equipment to find the right spot. °· A collecting needle will be used to gather the tissue. If you are awake, you will be asked to hold your breath as the needle is inserted and collects the tissue. Each insertion and collection lasts about 30 seconds or a little longer. You will be told when to exhale. °AFTER THE PROCEDURE °· You will lie on your back for 12 to 24 hours. If you have a transplanted kidney, you may not have to lie on your back. During this time, your back will probably feel sore. You may stay in the hospital overnight after the procedure so that staff can check your condition. °· You may notice some blood in your urine for 24 hours after the test. To detect any problems, your health care providers will: °¨ Monitor your blood pressure and pulse. °¨ Take blood samples to measure the amount of red blood cells. °¨ Examine the urine that you pass. °· On rare occasions when bleeding is excessive, it may be necessary to replace lost blood with a transfusion. °· It is your responsibility to obtain your test results.   Ask the lab or department performing the test when and how you will get your results. °FOR MORE INFORMATION °· [employer]: www.akfinc.org °· National Kidney Foundation: www.kidney.org °· National Kidney and Urologic Diseases Information Clearinghouse: http://kidney.niddk.nih.gov °Document Released: 12/07/2003 Document Revised: 11/16/2012 Document Reviewed: 08/01/2012 °ExitCare® Patient Information ©2015 ExitCare,  LLC. This information is not intended to replace advice given to you by your health care provider. Make sure you discuss any questions you have with your health care provider. ° °

## 2013-10-13 NOTE — Progress Notes (Signed)
Patient ID: Ariel Johns, female   DOB: 1985/10/03, 28 y.o.   MRN: [id number] Physician Discharge Summary  Patient ID: Ariel Johns MRN: [id number] DOB/AGE: [birth year] 28 y.o.  Admit date: 10/12/2013 Discharge date: 10/13/2013  Admission Diagnoses:Nephrotic syndrome  Discharge Diagnoses: Nephrotic syndrome Active Problems:   Nephrotic syndrome   Discharged Condition: stable  Hospital Course: Pt was admitted on 10/12/13 for renal biopsy.  She tolerated procedure well and Hgb remained stable.  Pt was discharged to home in stable condition.  Treatments: procedures: biopsy: Left kidney  Blood pressure 140/86, pulse 74, temperature 97.6 F (36.4 C), temperature source Oral, resp. rate 18, height  (1.676 m), weight 95.48 kg (210 lb 7.9 oz), last menstrual period 09/28/2013, SpO2 100.00%.  Disposition:      Medication List    ASK your doctor about these medications       clindamycin 300 MG capsule  Commonly known as:  CLEOCIN  Take 300 mg by mouth 2 (two) times daily.     cloNIDine 0.1 MG tablet  Commonly known as:  CATAPRES  Take 0.1 mg by mouth 2 (two) times daily.     furosemide 40 MG tablet  Commonly known as:  LASIX  Take 80 mg by mouth 3 (three) times daily.     hydroxychloroquine 200 MG tablet  Commonly known as:  PLAQUENIL  Take 200 mg by mouth 2 (two) times daily.     mupirocin ointment 2 %  Commonly known as:  BACTROBAN  Apply 1 application topically 2 (two) times daily.     mycophenolate 180 MG EC tablet  Commonly known as:  MYFORTIC  Take 180 mg by mouth 2 (two) times daily.     predniSONE 10 MG tablet  Commonly known as:  DELTASONE  Take 10-20 mg by mouth 2 (two) times daily. Take 20 mg by mouth twice daily for 2 days, then take 10 mg by mouth twice daily for 2 days.     ranitidine 150 MG tablet  Commonly known as:  ZANTAC  Take 150 mg by mouth daily.     telmisartan 80 MG tablet  Commonly known as:  MICARDIS  Take 80 mg by mouth  daily.         Signed: Barnett Elzey A 10/13/2013, 1:55 PM

## 2013-10-16 LAB — TYPE AND SCREEN
ABO/RH(D): A NEG
ANTIBODY SCREEN: NEGATIVE
UNIT DIVISION: 0
Unit division: 0

## 2013-10-23 NOTE — Procedures (Signed)
Patient in prone position.  Kidneys localized with U/S.  Prep clorohexadine, xylocaine LA.  Using U/S guidance 2 passes to obtain 2 cores of tissue.  EBL N/A.  Tolerated well.  No complications.

## 2013-10-23 NOTE — Discharge Summary (Signed)
Physician Discharge Summary  Patient ID: KHRYSTAL JEANMARIE MRN: [id number] DOB/AGE: 07-Aug-1985 28 y.o.  Admit date: 10/12/2013 Discharge date: 10/23/2013  Admission Diagnoses:Nephrotic syndrome   Discharge Diagnoses: Nephrotic syndrome Active Problems:   Nephrotic syndrome   Discharged Condition: good  Hospital Course: Pt with h/o lupus nephritis now with nephrotic syndrome admitted for renal biopsy which was performed without complication.  Pt was discharged to home in stable condition  Consults: None  Significant Diagnostic Studies: renal biopsy  Treatments: procedures: biopsy: left kidney  Blood pressure 140/86, pulse 74, temperature 97.6 F (36.4 C), temperature source Oral, resp. rate 18, height  (1.676 m), weight 95.48 kg (210 lb 7.9 oz), last menstrual period 09/28/2013, SpO2 100.00%.  Disposition: 01-Home or Self Care  Discharge Instructions   Discharge diet:    Complete by:  As directed      Discharge instructions    Complete by:  As directed   To follow renal diet as instucted     Increase activity slowly    Complete by:  As directed             Medication List         clindamycin 300 MG capsule  Commonly known as:  CLEOCIN  Take 300 mg by mouth 2 (two) times daily.     cloNIDine 0.1 MG tablet  Commonly known as:  CATAPRES  Take 0.1 mg by mouth 2 (two) times daily.     furosemide 40 MG tablet  Commonly known as:  LASIX  Take 80 mg by mouth 3 (three) times daily.     hydroxychloroquine 200 MG tablet  Commonly known as:  PLAQUENIL  Take 200 mg by mouth 2 (two) times daily.     mupirocin ointment 2 %  Commonly known as:  BACTROBAN  Apply 1 application topically 2 (two) times daily.     mycophenolate 180 MG EC tablet  Commonly known as:  MYFORTIC  Take 1 tablet (180 mg total) by mouth 2 (two) times daily.     predniSONE 10 MG tablet  Commonly known as:  DELTASONE  Take 10-20 mg by mouth 2 (two) times daily. Take 20 mg by mouth twice  daily for 2 days, then take 10 mg by mouth twice daily for 2 days.     ranitidine 150 MG tablet  Commonly known as:  ZANTAC  Take 150 mg by mouth daily.     telmisartan 80 MG tablet  Commonly known as:  MICARDIS  Take 80 mg by mouth daily.         Signed: Deretha Ertle A 10/23/2013, 8:16 AM

## 2013-10-24 ENCOUNTER — Encounter (HOSPITAL_COMMUNITY): Payer: Self-pay

## 2013-11-13 ENCOUNTER — Encounter (HOSPITAL_COMMUNITY): Admission: RE | Admit: 2013-11-13 | Payer: BC Managed Care – PPO | Source: Ambulatory Visit

## 2013-11-13 ENCOUNTER — Other Ambulatory Visit (HOSPITAL_COMMUNITY): Payer: Self-pay | Admitting: Nephrology

## 2013-11-27 ENCOUNTER — Encounter (HOSPITAL_COMMUNITY): Payer: Self-pay

## 2013-11-27 ENCOUNTER — Encounter (HOSPITAL_COMMUNITY)
Admission: RE | Admit: 2013-11-27 | Discharge: 2013-11-27 | Disposition: A | Discharge status: Home / Self Care | Payer: BC Managed Care – PPO | Source: Ambulatory Visit | Attending: Nephrology | Admitting: Nephrology

## 2013-11-27 DIAGNOSIS — M3214 Glomerular disease in systemic lupus erythematosus: Secondary | ICD-10-CM | POA: Diagnosis present

## 2013-11-27 HISTORY — DX: Gastro-esophageal reflux disease without esophagitis: K21.9

## 2013-11-27 HISTORY — DX: Essential (primary) hypertension: I10

## 2013-11-27 HISTORY — DX: Glomerular disease in systemic lupus erythematosus: M32.14

## 2013-11-27 HISTORY — DX: Raynaud's syndrome without gangrene: I73.00

## 2013-11-27 LAB — CBC WITH DIFFERENTIAL/PLATELET
Basophils Absolute: 0 10*3/uL (ref 0.0–0.1)
Basophils Relative: 0 % (ref 0–1)
Eosinophils Absolute: 0 10*3/uL (ref 0.0–0.7)
Eosinophils Relative: 0 % (ref 0–5)
HCT: 31.5 % — ABNORMAL LOW (ref 36.0–46.0)
HEMOGLOBIN: 10.4 g/dL — AB (ref 12.0–15.0)
LYMPHS ABS: 2.2 10*3/uL (ref 0.7–4.0)
LYMPHS PCT: 24 % (ref 12–46)
MCH: 28 pg (ref 26.0–34.0)
MCHC: 33 g/dL (ref 30.0–36.0)
MCV: 84.9 fL (ref 78.0–100.0)
MONOS PCT: 8 % (ref 3–12)
Monocytes Absolute: 0.7 10*3/uL (ref 0.1–1.0)
NEUTROS ABS: 6.1 10*3/uL (ref 1.7–7.7)
Neutrophils Relative %: 68 % (ref 43–77)
Platelets: 261 10*3/uL (ref 150–400)
RBC: 3.71 MIL/uL — AB (ref 3.87–5.11)
RDW: 14.6 % (ref 11.5–15.5)
WBC: 9 10*3/uL (ref 4.0–10.5)

## 2013-11-27 LAB — CREATININE, SERUM
Creatinine, Ser: 1.01 mg/dL (ref 0.50–1.10)
GFR calc non Af Amer: 74 mL/min — ABNORMAL LOW (ref >90)
GFR, EST AFRICAN AMERICAN: 86 mL/min — AB (ref >90)

## 2013-11-27 LAB — BILIRUBIN, TOTAL

## 2013-11-27 MED ORDER — SODIUM CHLORIDE 0.9 % IV SOLN
150.0000 mg/m2 | INTRAVENOUS | Status: DC
  Administered 2013-11-27: 300 mg via INTRAVENOUS
  Filled 2013-11-27: qty 3

## 2013-11-27 MED ORDER — SODIUM CHLORIDE 0.9 % IV SOLN
INTRAVENOUS | Status: DC
  Administered 2013-11-27: 1000 mL via INTRAVENOUS

## 2013-11-27 MED ORDER — SODIUM CHLORIDE 0.9 % IV SOLN
500.0000 mg | INTRAVENOUS | Status: DC
  Administered 2013-11-27: 500 mg via INTRAVENOUS
  Filled 2013-11-27: qty 25

## 2013-11-27 MED ORDER — ONDANSETRON HCL 8 MG PO TABS
24.0000 mg | ORAL_TABLET | ORAL | Status: DC
  Administered 2013-11-27: 24 mg via ORAL
  Filled 2013-11-27: qty 3

## 2013-11-27 MED ORDER — SODIUM CHLORIDE 0.9 % IV SOLN
100.0000 mg/m2 | INTRAVENOUS | Status: DC
  Administered 2013-11-27: 200 mg via INTRAVENOUS
  Filled 2013-11-27: qty 2

## 2013-11-27 NOTE — Discharge Instructions (Signed)
CYTOXAN Cyclophosphamide injection What is this medicine? CYCLOPHOSPHAMIDE (sye kloe FOSS fa mide) is a chemotherapy drug. It slows the growth of cancer cells. This medicine is used to treat many types of cancer like lymphoma, myeloma, leukemia, breast cancer, and ovarian cancer, to name a few. This medicine may be used for other purposes; ask your health care provider or pharmacist if you have questions.( Lupus  Neuritis) COMMON BRAND NAME(S): Cytoxan, Neosar What should I tell my health care provider before I take this medicine? They need to know if you have any of these conditions: -blood disorders -history of other chemotherapy -infection -kidney disease -liver disease -recent or ongoing radiation therapy -tumors in the bone marrow -an unusual or allergic reaction to cyclophosphamide, other chemotherapy, other medicines, foods, dyes, or preservatives -pregnant or trying to get pregnant -breast-feeding How should I use this medicine? This drug is usually given as an injection into a vein or muscle or by infusion into a vein. It is administered in a hospital or clinic by a specially trained health care professional. Talk to your pediatrician regarding the use of this medicine in children. Special care may be needed. Overdosage: If you think you have taken too much of this medicine contact a poison control center or emergency room at once. NOTE: This medicine is only for you. Do not share this medicine with others. What if I miss a dose? It is important not to miss your dose. Call your doctor or health care professional if you are unable to keep an appointment. What may interact with this medicine? This medicine may interact with the following medications: -amiodarone -amphotericin B -azathioprine -certain antiviral medicines for HIV or AIDS such as protease inhibitors (e.g., indinavir, ritonavir) and zidovudine -certain blood pressure medications such as benazepril, captopril,  enalapril, fosinopril, lisinopril, moexipril, monopril, perindopril, quinapril, ramipril, trandolapril -certain cancer medications such as anthracyclines (e.g., daunorubicin, doxorubicin), busulfan, cytarabine, paclitaxel, pentostatin, tamoxifen, trastuzumab -certain diuretics such as chlorothiazide, chlorthalidone, hydrochlorothiazide, indapamide, metolazone -certain medicines that treat or prevent blood clots like warfarin -certain muscle relaxants such as succinylcholine -cyclosporine -etanercept -indomethacin -medicines to increase blood counts like filgrastim, pegfilgrastim, sargramostim -medicines used as general anesthesia -metronidazole -natalizumab This list may not describe all possible interactions. Give your health care provider a list of all the medicines, herbs, non-prescription drugs, or dietary supplements you use. Also tell them if you smoke, drink alcohol, or use illegal drugs. Some items may interact with your medicine. What should I watch for while using this medicine? Visit your doctor for checks on your progress. This drug may make you feel generally unwell. This is not uncommon, as chemotherapy can affect healthy cells as well as cancer cells. Report any side effects. Continue your course of treatment even though you feel ill unless your doctor tells you to stop. Drink water or other fluids as directed. Urinate often, even at night. In some cases, you may be given additional medicines to help with side effects. Follow all directions for their use. Call your doctor or health care professional for advice if you get a fever, chills or sore throat, or other symptoms of a cold or flu. Do not treat yourself. This drug decreases your body's ability to fight infections. Try to avoid being around people who are sick. This medicine may increase your risk to bruise or bleed. Call your doctor or health care professional if you notice any unusual bleeding. Be careful brushing and  flossing your teeth or using a toothpick because you may get  an infection or bleed more easily. If you have any dental work done, tell your dentist you are receiving this medicine. You may get drowsy or dizzy. Do not drive, use machinery, or do anything that needs mental alertness until you know how this medicine affects you. Do not become pregnant while taking this medicine or for 1 year after stopping it. Women should inform their doctor if they wish to become pregnant or think they might be pregnant. Men should not father a child while taking this medicine and for 4 months after stopping it. There is a potential for serious side effects to an unborn child. Talk to your health care professional or pharmacist for more information. Do not breast-feed an infant while taking this medicine. This medicine may interfere with the ability to have a child. This medicine has caused ovarian failure in some women. This medicine has caused reduced sperm counts in some men. You should talk with your doctor or health care professional if you are concerned about your fertility. If you are going to have surgery, tell your doctor or health care professional that you have taken this medicine. What side effects may I notice from receiving this medicine? Side effects that you should report to your doctor or health care professional as soon as possible: -allergic reactions like skin rash, itching or hives, swelling of the face, lips, or tongue -low blood counts - this medicine may decrease the number of white blood cells, red blood cells and platelets. You may be at increased risk for infections and bleeding. -signs of infection - fever or chills, cough, sore throat, pain or difficulty passing urine -signs of decreased platelets or bleeding - bruising, pinpoint red spots on the skin, black, tarry stools, blood in the urine -signs of decreased red blood cells - unusually weak or tired, fainting spells,  lightheadedness -breathing problems -dark urine -dizziness -palpitations -swelling of the ankles, feet, hands -trouble passing urine or change in the amount of urine -weight gain -yellowing of the eyes or skin Side effects that usually do not require medical attention (report to your doctor or health care professional if they continue or are bothersome): -changes in nail or skin color -hair loss -missed menstrual periods -mouth sores -nausea, vomiting This list may not describe all possible side effects. Call your doctor for medical advice about side effects. You may report side effects to FDA at 1-800-FDA-1088. Where should I keep my medicine? This drug is given in a hospital or clinic and will not be stored at home. NOTE: This sheet is a summary. It may not cover all possible information. If you have questions about this medicine, talk to your doctor, pharmacist, or health care provider.  2015, Elsevier/Gold Standard. (2011-12-11 16:22:58) Lupus Lupus (also called systemic lupus erythematosus, SLE) is a disorder of the body's natural defense system (immune system). In lupus, the immune system attacks various areas of the body (autoimmune disease). CAUSES The cause is unknown. However, lupus runs in families. Certain genes can make you more likely to develop lupus. It is 10 times more common in women than in men. Lupus is also more common in African Americans and Asians. Other factors also play a role, such as viruses (Epstein-Barr virus, EBV), stress, hormones, cigarette smoke, and certain drugs. SYMPTOMS Lupus can affect many parts of the body, including the joints, skin, kidneys, lungs, heart, nervous system, and blood vessels. The signs and symptoms of lupus differ from person to person. The disease can range from mild to  life-threatening. Typical features of lupus include:  Butterfly-shaped rash over the face.  Arthritis involving one or more joints.  Kidney disease.  Fever,  weight loss, hair loss, fatigue.  Poor circulation in the fingers and toes (Raynaud's disease).  Chest pain when taking deep breaths. Abdominal pain may also occur.  Skin rash in areas exposed to the sun.  Sores in the mouth and nose. DIAGNOSIS Diagnosing lupus can take a long time and is often difficult. An exam and an accurate account of your symptoms and health problems is very important. Blood tests are necessary, though no single test can confirm or rule out lupus. Most people with lupus test positive for antinuclear antibodies (ANA) on a blood test. Additional blood tests, a urine test (urinalysis), and sometimes a kidney or skin tissue sample (biopsy) can help to confirm or rule out lupus. TREATMENT There is no cure for lupus. Your caregiver will develop a treatment plan based on your age, sex, health, symptoms, and lifestyle. The goals are to prevent flares, to treat them when they do occur, and to minimize organ damage and complications. How the disease may affect each person varies widely. Most people with lupus can live normal lives, but this disorder must be carefully monitored. Treatment must be adjusted as necessary to prevent serious complications. Medicines used for treatment:  Nonsteroidal anti-inflammatory drugs (NSAIDs) decrease inflammation and can help with chest pain, joint pain, and fevers. Examples include ibuprofen and naproxen.  Antimalarial drugs were designed to treat malaria. They also treat fatigue, joint pain, skin rashes, and inflammation of the lungs in patients with lupus.  Corticosteroids are powerful hormones that rapidly suppress inflammation. The lowest dose with the highest benefit will be chosen. They can be given by cream, pills, injections, and through the vein (intravenously).  Immunosuppressive drugs block the making of immune cells. They may be used for kidney or nerve disease. HOME CARE INSTRUCTIONS  Exercise. Low-impact activities can usually  help keep joints flexible without being too strenuous.  Rest after periods of exercise.  Avoid excessive sun exposure.  Follow proper nutrition and take supplements as recommended by your caregiver.  Stress management can be helpful. SEEK MEDICAL CARE IF:  You have increased fatigue.  You develop pain.  You develop a rash.  You have an oral temperature above 102 F (38.9 C).  You develop abdominal discomfort.  You develop a headache.  You experience dizziness. FOR MORE INFORMATION National Institute of Neurological Disorders and Stroke: [employer].co.ukwww.ninds.nih.gov [employer] of [employer]umatology: www.rheumatology.[facility] Centerorg National Institute of Arthritis and Musculoskeletal and Skin Diseases: www.niams.http://www.myers.net/nih.gov Document Released: 01/16/2002 Document Revised: 04/20/2011 Document Reviewed: 05/09/2009 [facility] Patient Information 2015 [employer], [place]. This information is not intended to replace advice given to you by your health care provider. Make sure you discuss any questions you have with your health care provider.

## 2013-11-27 NOTE — Progress Notes (Signed)
Uneventful infusion of CYTOXAN # 1/6 . Pt is to return 12/11/13 for 2nd infusion

## 2013-12-11 ENCOUNTER — Encounter (HOSPITAL_COMMUNITY): Payer: Self-pay

## 2013-12-11 ENCOUNTER — Encounter (HOSPITAL_COMMUNITY)
Admission: RE | Admit: 2013-12-11 | Discharge: 2013-12-11 | Disposition: A | Discharge status: Home / Self Care | Payer: BC Managed Care – PPO | Source: Ambulatory Visit | Attending: Nephrology | Admitting: Nephrology

## 2013-12-11 DIAGNOSIS — M3214 Glomerular disease in systemic lupus erythematosus: Secondary | ICD-10-CM | POA: Diagnosis present

## 2013-12-11 LAB — CBC WITH DIFFERENTIAL/PLATELET
Basophils Absolute: 0 10*3/uL (ref 0.0–0.1)
Basophils Relative: 0 % (ref 0–1)
EOS ABS: 0 10*3/uL (ref 0.0–0.7)
EOS PCT: 0 % (ref 0–5)
HCT: 33 % — ABNORMAL LOW (ref 36.0–46.0)
HEMOGLOBIN: 10.7 g/dL — AB (ref 12.0–15.0)
LYMPHS PCT: 11 % — AB (ref 12–46)
Lymphs Abs: 1 10*3/uL (ref 0.7–4.0)
MCH: 27.4 pg (ref 26.0–34.0)
MCHC: 32.4 g/dL (ref 30.0–36.0)
MCV: 84.6 fL (ref 78.0–100.0)
MONOS PCT: 9 % (ref 3–12)
Monocytes Absolute: 0.8 10*3/uL (ref 0.1–1.0)
Neutro Abs: 7.2 10*3/uL (ref 1.7–7.7)
Neutrophils Relative %: 80 % — ABNORMAL HIGH (ref 43–77)
Platelets: 319 10*3/uL (ref 150–400)
RBC: 3.9 MIL/uL (ref 3.87–5.11)
RDW: 14.2 % (ref 11.5–15.5)
WBC: 9 10*3/uL (ref 4.0–10.5)

## 2013-12-11 LAB — BILIRUBIN, TOTAL: Total Bilirubin: <0.2 mg/dL — ABNORMAL LOW (ref 0.3–1.2)

## 2013-12-11 LAB — CREATININE, SERUM
Creatinine, Ser: 0.76 mg/dL (ref 0.50–1.10)
GFR calc Af Amer: >90 mL/min (ref >90)

## 2013-12-11 MED ORDER — MESNA 100 MG/ML IV SOLN
100.0000 mg/m2 | INTRAVENOUS | Status: DC
  Administered 2013-12-11: 200 mg via INTRAVENOUS
  Filled 2013-12-11: qty 2

## 2013-12-11 MED ORDER — ONDANSETRON HCL 8 MG PO TABS
24.0000 mg | ORAL_TABLET | ORAL | Status: DC
  Administered 2013-12-11: 24 mg via ORAL
  Filled 2013-12-11: qty 3

## 2013-12-11 MED ORDER — SODIUM CHLORIDE 0.9 % IV SOLN
150.0000 mg/m2 | INTRAVENOUS | Status: DC
  Administered 2013-12-11: 300 mg via INTRAVENOUS
  Filled 2013-12-11: qty 3

## 2013-12-11 MED ORDER — SODIUM CHLORIDE 0.9 % IV SOLN
500.0000 mg | INTRAVENOUS | Status: DC
  Administered 2013-12-11: 500 mg via INTRAVENOUS
  Filled 2013-12-11: qty 25

## 2013-12-11 MED ORDER — SODIUM CHLORIDE 0.9 % IV SOLN
INTRAVENOUS | Status: DC
  Administered 2013-12-11: 08:00:00 via INTRAVENOUS

## 2013-12-11 NOTE — Progress Notes (Signed)
Patient has tolerated treatment #2/6. No immediate complications noted

## 2013-12-11 NOTE — Progress Notes (Signed)
Patient states she had a low grade fever x 1 week ago. Fells like it was a virus. Afebrile and no problems at present. Will draw labs , as ordered.

## 2013-12-25 ENCOUNTER — Encounter (HOSPITAL_COMMUNITY): Payer: Self-pay

## 2013-12-25 ENCOUNTER — Encounter (HOSPITAL_COMMUNITY)
Admission: RE | Admit: 2013-12-25 | Discharge: 2013-12-25 | Disposition: A | Discharge status: Home / Self Care | Payer: BC Managed Care – PPO | Source: Ambulatory Visit | Attending: Nephrology | Admitting: Nephrology

## 2013-12-25 DIAGNOSIS — M3214 Glomerular disease in systemic lupus erythematosus: Secondary | ICD-10-CM | POA: Diagnosis not present

## 2013-12-25 LAB — CBC WITH DIFFERENTIAL/PLATELET
BASOS PCT: 0 % (ref 0–1)
Basophils Absolute: 0 10*3/uL (ref 0.0–0.1)
EOS PCT: 1 % (ref 0–5)
Eosinophils Absolute: 0.1 10*3/uL (ref 0.0–0.7)
HCT: 35.3 % — ABNORMAL LOW (ref 36.0–46.0)
HEMOGLOBIN: 11.7 g/dL — AB (ref 12.0–15.0)
Lymphocytes Relative: 26 % (ref 12–46)
Lymphs Abs: 2.1 10*3/uL (ref 0.7–4.0)
MCH: 27.5 pg (ref 26.0–34.0)
MCHC: 33.1 g/dL (ref 30.0–36.0)
MCV: 83.1 fL (ref 78.0–100.0)
MONO ABS: 0.8 10*3/uL (ref 0.1–1.0)
Monocytes Relative: 10 % (ref 3–12)
Neutro Abs: 5.1 10*3/uL (ref 1.7–7.7)
Neutrophils Relative %: 63 % (ref 43–77)
Platelets: 277 10*3/uL (ref 150–400)
RBC: 4.25 MIL/uL (ref 3.87–5.11)
RDW: 14.1 % (ref 11.5–15.5)
WBC: 8.1 10*3/uL (ref 4.0–10.5)

## 2013-12-25 LAB — CREATININE, SERUM: Creatinine, Ser: 0.86 mg/dL (ref 0.50–1.10)

## 2013-12-25 LAB — BILIRUBIN, TOTAL

## 2013-12-25 MED ORDER — SODIUM CHLORIDE 0.9 % IV SOLN
100.0000 mg/m2 | INTRAVENOUS | Status: DC
  Administered 2013-12-25: 200 mg via INTRAVENOUS
  Filled 2013-12-25: qty 2

## 2013-12-25 MED ORDER — CYCLOPHOSPHAMIDE CHEMO INJECTION 1 GM
500.0000 mg | INTRAMUSCULAR | Status: DC
  Administered 2013-12-25: 500 mg via INTRAVENOUS
  Filled 2013-12-25: qty 25

## 2013-12-25 MED ORDER — SODIUM CHLORIDE 0.9 % IV SOLN
150.0000 mg/m2 | INTRAVENOUS | Status: DC
  Administered 2013-12-25: 300 mg via INTRAVENOUS
  Filled 2013-12-25: qty 3

## 2013-12-25 MED ORDER — ONDANSETRON HCL 8 MG PO TABS
24.0000 mg | ORAL_TABLET | ORAL | Status: DC
  Administered 2013-12-25: 24 mg via ORAL
  Filled 2013-12-25: qty 3

## 2013-12-25 MED ORDER — SODIUM CHLORIDE 0.9 % IV SOLN
INTRAVENOUS | Status: DC
  Administered 2013-12-25: 1000 mL via INTRAVENOUS

## 2014-01-08 ENCOUNTER — Encounter (HOSPITAL_COMMUNITY): Payer: Self-pay

## 2014-01-08 ENCOUNTER — Encounter (HOSPITAL_COMMUNITY)
Admission: RE | Admit: 2014-01-08 | Discharge: 2014-01-08 | Disposition: A | Discharge status: Home / Self Care | Payer: BC Managed Care – PPO | Source: Ambulatory Visit | Attending: Nephrology | Admitting: Nephrology

## 2014-01-08 DIAGNOSIS — M3214 Glomerular disease in systemic lupus erythematosus: Secondary | ICD-10-CM | POA: Diagnosis not present

## 2014-01-08 LAB — CBC WITH DIFFERENTIAL/PLATELET
BASOS PCT: 0 % (ref 0–1)
Basophils Absolute: 0 10*3/uL (ref 0.0–0.1)
EOS ABS: 0.1 10*3/uL (ref 0.0–0.7)
Eosinophils Relative: 1 % (ref 0–5)
HCT: 35.9 % — ABNORMAL LOW (ref 36.0–46.0)
Hemoglobin: 11.9 g/dL — ABNORMAL LOW (ref 12.0–15.0)
LYMPHS PCT: 23 % (ref 12–46)
Lymphs Abs: 1.7 10*3/uL (ref 0.7–4.0)
MCH: 27.4 pg (ref 26.0–34.0)
MCHC: 33.1 g/dL (ref 30.0–36.0)
MCV: 82.7 fL (ref 78.0–100.0)
MONO ABS: 0.5 10*3/uL (ref 0.1–1.0)
Monocytes Relative: 7 % (ref 3–12)
Neutro Abs: 4.9 10*3/uL (ref 1.7–7.7)
Neutrophils Relative %: 69 % (ref 43–77)
PLATELETS: 246 10*3/uL (ref 150–400)
RBC: 4.34 MIL/uL (ref 3.87–5.11)
RDW: 14 % (ref 11.5–15.5)
WBC: 7.2 10*3/uL (ref 4.0–10.5)

## 2014-01-08 LAB — CREATININE, SERUM
Creatinine, Ser: 0.82 mg/dL (ref 0.50–1.10)
GFR calc Af Amer: >90 mL/min (ref >90)

## 2014-01-08 LAB — BILIRUBIN, TOTAL

## 2014-01-08 MED ORDER — SODIUM CHLORIDE 0.9 % IV SOLN
100.0000 mg/m2 | INTRAVENOUS | Status: DC
  Administered 2014-01-08: 200 mg via INTRAVENOUS
  Filled 2014-01-08: qty 2

## 2014-01-08 MED ORDER — SODIUM CHLORIDE 0.9 % IV SOLN
500.0000 mg | INTRAVENOUS | Status: DC
  Administered 2014-01-08: 500 mg via INTRAVENOUS
  Filled 2014-01-08: qty 25

## 2014-01-08 MED ORDER — SODIUM CHLORIDE 0.9 % IV SOLN
150.0000 mg/m2 | INTRAVENOUS | Status: DC
  Administered 2014-01-08: 300 mg via INTRAVENOUS
  Filled 2014-01-08: qty 3

## 2014-01-08 MED ORDER — ONDANSETRON HCL 8 MG PO TABS
24.0000 mg | ORAL_TABLET | ORAL | Status: DC
  Administered 2014-01-08: 24 mg via ORAL
  Filled 2014-01-08: qty 3

## 2014-01-08 MED ORDER — SODIUM CHLORIDE 0.9 % IV SOLN
INTRAVENOUS | Status: DC
  Administered 2014-01-08: 1000 mL via INTRAVENOUS

## 2014-01-08 NOTE — Progress Notes (Signed)
#  3/6 infusion of Cytoxan was 12/25/13 and #4/6 infusion of Cytoxan on 01/08/14 were both uneventful.

## 2014-01-22 ENCOUNTER — Encounter (HOSPITAL_COMMUNITY)
Admission: RE | Admit: 2014-01-22 | Discharge: 2014-01-22 | Disposition: A | Discharge status: Home / Self Care | Payer: BC Managed Care – PPO | Source: Ambulatory Visit | Attending: Nephrology | Admitting: Nephrology

## 2014-01-22 ENCOUNTER — Encounter (HOSPITAL_COMMUNITY): Payer: Self-pay

## 2014-01-22 DIAGNOSIS — M3214 Glomerular disease in systemic lupus erythematosus: Secondary | ICD-10-CM | POA: Diagnosis present

## 2014-01-22 LAB — CBC WITH DIFFERENTIAL/PLATELET
BASOS ABS: 0 10*3/uL (ref 0.0–0.1)
BASOS PCT: 0 % (ref 0–1)
EOS ABS: 0.1 10*3/uL (ref 0.0–0.7)
Eosinophils Relative: 1 % (ref 0–5)
HCT: 35.6 % — ABNORMAL LOW (ref 36.0–46.0)
Hemoglobin: 11.6 g/dL — ABNORMAL LOW (ref 12.0–15.0)
Lymphocytes Relative: 23 % (ref 12–46)
Lymphs Abs: 1.4 10*3/uL (ref 0.7–4.0)
MCH: 27 pg (ref 26.0–34.0)
MCHC: 32.6 g/dL (ref 30.0–36.0)
MCV: 83 fL (ref 78.0–100.0)
MONOS PCT: 8 % (ref 3–12)
Monocytes Absolute: 0.5 10*3/uL (ref 0.1–1.0)
NEUTROS ABS: 4.1 10*3/uL (ref 1.7–7.7)
NEUTROS PCT: 68 % (ref 43–77)
PLATELETS: 253 10*3/uL (ref 150–400)
RBC: 4.29 MIL/uL (ref 3.87–5.11)
RDW: 13.8 % (ref 11.5–15.5)
WBC: 6 10*3/uL (ref 4.0–10.5)

## 2014-01-22 LAB — CREATININE, SERUM
Creatinine, Ser: 0.9 mg/dL (ref 0.50–1.10)
GFR, EST NON AFRICAN AMERICAN: 86 mL/min — AB (ref >90)

## 2014-01-22 LAB — BILIRUBIN, TOTAL

## 2014-01-22 MED ORDER — ONDANSETRON HCL 8 MG PO TABS
24.0000 mg | ORAL_TABLET | ORAL | Status: DC
  Administered 2014-01-22: 24 mg via ORAL
  Filled 2014-01-22: qty 3

## 2014-01-22 MED ORDER — SODIUM CHLORIDE 0.9 % IV SOLN
150.0000 mg/m2 | INTRAVENOUS | Status: DC
  Administered 2014-01-22: 300 mg via INTRAVENOUS
  Filled 2014-01-22: qty 3

## 2014-01-22 MED ORDER — SODIUM CHLORIDE 0.9 % IV SOLN
INTRAVENOUS | Status: DC
  Administered 2014-01-22: 09:00:00 via INTRAVENOUS

## 2014-01-22 MED ORDER — SODIUM CHLORIDE 0.9 % IV SOLN
100.0000 mg/m2 | INTRAVENOUS | Status: DC
  Administered 2014-01-22: 200 mg via INTRAVENOUS
  Filled 2014-01-22: qty 2

## 2014-01-22 MED ORDER — SODIUM CHLORIDE 0.9 % IV SOLN
500.0000 mg | INTRAVENOUS | Status: DC
  Administered 2014-01-22: 500 mg via INTRAVENOUS
  Filled 2014-01-22: qty 25

## 2014-02-06 ENCOUNTER — Encounter (HOSPITAL_COMMUNITY): Payer: Self-pay

## 2014-02-06 ENCOUNTER — Encounter (HOSPITAL_COMMUNITY)
Admission: RE | Admit: 2014-02-06 | Discharge: 2014-02-06 | Disposition: A | Discharge status: Home / Self Care | Payer: BC Managed Care – PPO | Source: Ambulatory Visit | Attending: Nephrology | Admitting: Nephrology

## 2014-02-06 DIAGNOSIS — M3214 Glomerular disease in systemic lupus erythematosus: Secondary | ICD-10-CM | POA: Diagnosis not present

## 2014-02-06 LAB — BILIRUBIN, TOTAL: Total Bilirubin: 0.5 mg/dL (ref 0.3–1.2)

## 2014-02-06 LAB — CBC WITH DIFFERENTIAL/PLATELET
BASOS ABS: 0 10*3/uL (ref 0.0–0.1)
BASOS PCT: 0 % (ref 0–1)
Eosinophils Absolute: 0 10*3/uL (ref 0.0–0.7)
Eosinophils Relative: 1 % (ref 0–5)
HEMATOCRIT: 35.1 % — AB (ref 36.0–46.0)
Hemoglobin: 11.3 g/dL — ABNORMAL LOW (ref 12.0–15.0)
Lymphocytes Relative: 20 % (ref 12–46)
Lymphs Abs: 0.9 10*3/uL (ref 0.7–4.0)
MCH: 26.3 pg (ref 26.0–34.0)
MCHC: 32.2 g/dL (ref 30.0–36.0)
MCV: 81.8 fL (ref 78.0–100.0)
Monocytes Absolute: 0.4 10*3/uL (ref 0.1–1.0)
Monocytes Relative: 9 % (ref 3–12)
NEUTROS PCT: 70 % (ref 43–77)
Neutro Abs: 3.3 10*3/uL (ref 1.7–7.7)
Platelets: 274 10*3/uL (ref 150–400)
RBC: 4.29 MIL/uL (ref 3.87–5.11)
RDW: 13.8 % (ref 11.5–15.5)
WBC: 4.7 10*3/uL (ref 4.0–10.5)

## 2014-02-06 LAB — CREATININE, SERUM
CREATININE: 0.8 mg/dL (ref 0.50–1.10)
GFR calc non Af Amer: >90 mL/min (ref >90)

## 2014-02-06 MED ORDER — SODIUM CHLORIDE 0.9 % IV SOLN
150.0000 mg/m2 | INTRAVENOUS | Status: DC
  Administered 2014-02-06: 300 mg via INTRAVENOUS
  Filled 2014-02-06: qty 3

## 2014-02-06 MED ORDER — SODIUM CHLORIDE 0.9 % IV SOLN
100.0000 mg/m2 | INTRAVENOUS | Status: DC
  Administered 2014-02-06: 200 mg via INTRAVENOUS
  Filled 2014-02-06: qty 2

## 2014-02-06 MED ORDER — ONDANSETRON HCL 8 MG PO TABS
24.0000 mg | ORAL_TABLET | ORAL | Status: DC
  Administered 2014-02-06: 24 mg via ORAL
  Filled 2014-02-06: qty 3

## 2014-02-06 MED ORDER — SODIUM CHLORIDE 0.9 % IV SOLN
500.0000 mg | INTRAVENOUS | Status: DC
  Administered 2014-02-06: 500 mg via INTRAVENOUS
  Filled 2014-02-06: qty 25

## 2014-02-06 MED ORDER — SODIUM CHLORIDE 0.9 % IV SOLN
INTRAVENOUS | Status: DC
  Administered 2014-02-06: 08:00:00 via INTRAVENOUS

## 2014-02-06 NOTE — Progress Notes (Signed)
Patient has tolerated treatment  # 6 of 6

## 2014-11-29 ENCOUNTER — Ambulatory Visit
Admission: RE | Admit: 2014-11-29 | Discharge: 2014-11-29 | Disposition: A | Discharge status: Home / Self Care | Payer: BLUE CROSS/BLUE SHIELD | Source: Ambulatory Visit | Attending: Nephrology | Admitting: Nephrology

## 2014-11-29 ENCOUNTER — Other Ambulatory Visit: Payer: Self-pay | Admitting: Nephrology

## 2014-11-29 DIAGNOSIS — R0789 Other chest pain: Secondary | ICD-10-CM

## 2015-07-05 ENCOUNTER — Other Ambulatory Visit (HOSPITAL_COMMUNITY): Payer: Self-pay | Admitting: Nephrology

## 2015-07-05 DIAGNOSIS — M3214 Glomerular disease in systemic lupus erythematosus: Secondary | ICD-10-CM

## 2015-07-11 ENCOUNTER — Other Ambulatory Visit: Payer: Self-pay | Admitting: General Surgery

## 2015-07-12 ENCOUNTER — Other Ambulatory Visit: Payer: Self-pay | Admitting: Radiology

## 2015-07-15 ENCOUNTER — Ambulatory Visit (HOSPITAL_COMMUNITY)
Admission: RE | Admit: 2015-07-15 | Discharge: 2015-07-15 | Disposition: A | Discharge status: Home / Self Care | Payer: BLUE CROSS/BLUE SHIELD | Source: Ambulatory Visit | Attending: Nephrology | Admitting: Nephrology

## 2015-07-15 ENCOUNTER — Encounter (HOSPITAL_COMMUNITY): Payer: Self-pay

## 2015-07-15 DIAGNOSIS — Z87891 Personal history of nicotine dependence: Secondary | ICD-10-CM | POA: Diagnosis not present

## 2015-07-15 DIAGNOSIS — Z7952 Long term (current) use of systemic steroids: Secondary | ICD-10-CM | POA: Insufficient documentation

## 2015-07-15 DIAGNOSIS — K219 Gastro-esophageal reflux disease without esophagitis: Secondary | ICD-10-CM | POA: Diagnosis not present

## 2015-07-15 DIAGNOSIS — Z885 Allergy status to narcotic agent status: Secondary | ICD-10-CM | POA: Diagnosis not present

## 2015-07-15 DIAGNOSIS — R319 Hematuria, unspecified: Secondary | ICD-10-CM | POA: Diagnosis not present

## 2015-07-15 DIAGNOSIS — M3214 Glomerular disease in systemic lupus erythematosus: Secondary | ICD-10-CM | POA: Insufficient documentation

## 2015-07-15 DIAGNOSIS — I10 Essential (primary) hypertension: Secondary | ICD-10-CM | POA: Diagnosis not present

## 2015-07-15 DIAGNOSIS — Z880 Allergy status to penicillin: Secondary | ICD-10-CM | POA: Insufficient documentation

## 2015-07-15 DIAGNOSIS — Z79899 Other long term (current) drug therapy: Secondary | ICD-10-CM | POA: Insufficient documentation

## 2015-07-15 DIAGNOSIS — I7300 Raynaud's syndrome without gangrene: Secondary | ICD-10-CM | POA: Diagnosis not present

## 2015-07-15 LAB — PREGNANCY, URINE: Preg Test, Ur: NEGATIVE

## 2015-07-15 LAB — CBC
HEMATOCRIT: 31.9 % — AB (ref 36.0–46.0)
HEMOGLOBIN: 9.9 g/dL — AB (ref 12.0–15.0)
MCH: 24.4 pg — ABNORMAL LOW (ref 26.0–34.0)
MCHC: 31 g/dL (ref 30.0–36.0)
MCV: 78.8 fL (ref 78.0–100.0)
Platelets: 339 10*3/uL (ref 150–400)
RBC: 4.05 MIL/uL (ref 3.87–5.11)
RDW: 13.2 % (ref 11.5–15.5)
WBC: 6.2 10*3/uL (ref 4.0–10.5)

## 2015-07-15 LAB — APTT: APTT: 27 s (ref 24–37)

## 2015-07-15 LAB — PROTIME-INR
INR: 0.9 (ref 0.00–1.49)
Prothrombin Time: 12.4 seconds (ref 11.6–15.2)

## 2015-07-15 MED ORDER — IRBESARTAN 300 MG PO TABS
300.0000 mg | ORAL_TABLET | Freq: Every day | ORAL | Status: DC
  Administered 2015-07-15: 300 mg via ORAL
  Filled 2015-07-15: qty 1

## 2015-07-15 MED ORDER — ONDANSETRON HCL 4 MG/2ML IJ SOLN
INTRAMUSCULAR | Status: AC
  Filled 2015-07-15: qty 2

## 2015-07-15 MED ORDER — FENTANYL CITRATE (PF) 100 MCG/2ML IJ SOLN
INTRAMUSCULAR | Status: AC
  Filled 2015-07-15: qty 2

## 2015-07-15 MED ORDER — ACETAMINOPHEN 325 MG PO TABS
650.0000 mg | ORAL_TABLET | Freq: Once | ORAL | Status: AC
  Administered 2015-07-15: 650 mg via ORAL

## 2015-07-15 MED ORDER — HYDRALAZINE HCL 20 MG/ML IJ SOLN
10.0000 mg | Freq: Once | INTRAMUSCULAR | Status: AC
  Administered 2015-07-15: 10 mg via INTRAVENOUS

## 2015-07-15 MED ORDER — LIDOCAINE HCL (PF) 1 % IJ SOLN
INTRAMUSCULAR | Status: AC
  Filled 2015-07-15: qty 10

## 2015-07-15 MED ORDER — LABETALOL HCL 5 MG/ML IV SOLN
5.0000 mg | Freq: Once | INTRAVENOUS | Status: AC
  Administered 2015-07-15: 5 mg via INTRAVENOUS

## 2015-07-15 MED ORDER — FENTANYL CITRATE (PF) 100 MCG/2ML IJ SOLN
INTRAMUSCULAR | Status: AC | PRN
  Administered 2015-07-15 (×2): 50 ug via INTRAVENOUS

## 2015-07-15 MED ORDER — HYDRALAZINE HCL 20 MG/ML IJ SOLN
INTRAMUSCULAR | Status: AC | PRN
  Administered 2015-07-15: 10 mg via INTRAVENOUS

## 2015-07-15 MED ORDER — MIDAZOLAM HCL 2 MG/2ML IJ SOLN
INTRAMUSCULAR | Status: AC
  Filled 2015-07-15: qty 2

## 2015-07-15 MED ORDER — HYDROCODONE-ACETAMINOPHEN 5-325 MG PO TABS: 1.0000 | ORAL_TABLET | ORAL | Status: DC | PRN

## 2015-07-15 MED ORDER — ACETAMINOPHEN 325 MG PO TABS
ORAL_TABLET | ORAL | Status: AC
  Filled 2015-07-15: qty 2

## 2015-07-15 MED ORDER — SODIUM CHLORIDE 0.9 % IV SOLN: INTRAVENOUS | Status: DC

## 2015-07-15 MED ORDER — LABETALOL HCL 5 MG/ML IV SOLN
INTRAVENOUS | Status: AC
  Filled 2015-07-15: qty 4

## 2015-07-15 MED ORDER — HYDRALAZINE HCL 20 MG/ML IJ SOLN
INTRAMUSCULAR | Status: AC
  Administered 2015-07-15: 10 mg
  Filled 2015-07-15: qty 1

## 2015-07-15 MED ORDER — ONDANSETRON HCL 4 MG/2ML IJ SOLN
4.0000 mg | Freq: Once | INTRAMUSCULAR | Status: AC
  Administered 2015-07-15: 4 mg via INTRAVENOUS

## 2015-07-15 MED ORDER — CLONIDINE HCL 0.1 MG PO TABS
0.1000 mg | ORAL_TABLET | Freq: Once | ORAL | Status: AC
  Administered 2015-07-15: 0.1 mg via ORAL
  Filled 2015-07-15: qty 1

## 2015-07-15 MED ORDER — HYDRALAZINE HCL 20 MG/ML IJ SOLN
INTRAMUSCULAR | Status: AC
  Filled 2015-07-15: qty 1

## 2015-07-15 MED ORDER — MIDAZOLAM HCL 2 MG/2ML IJ SOLN
INTRAMUSCULAR | Status: AC | PRN
Start: 2015-07-15 — End: 2015-07-15
  Administered 2015-07-15 (×2): 1 mg via INTRAVENOUS

## 2015-07-15 NOTE — Sedation Documentation (Signed)
Patient is resting comfortably. No complaints at this time, vitals stable. 

## 2015-07-15 NOTE — Procedures (Signed)
LLP renal core bx 16g x2 to surg path No complication No blood loss. [facility].

## 2015-07-15 NOTE — H&P (Signed)
Chief Complaint: Patient was seen in consultation today for random renal biopsy at the request of Coladonato,Joseph  Referring Physician(s): Coladonato,Joseph  Supervising Physician: Oley BalmHassell, Daniel  Patient Status: Out-pt  History of Present Illness: Ariel Johns is a 30 y.o. female   Hx Lupus since age 30 Known proteinuria and followed for 8 yrs Recent increase in proteinuria and new hematuria Lupus nephritis Request for random renal biopsy per Dr Abel Prestoolodonato BP this am 177/109 Has now been given home po meds Will watch BP----Dr Hassellaware  Past Medical History  Diagnosis Date  . Lupus nephritis (HCC) 2009    diagnosed at age 30  . Hypertension   . GERD (gastroesophageal reflux disease)   . Raynaud disease     Past Surgical History  Procedure Laterality Date  . Renal biopsy    . Lymphnode biopsy    . Wisdom tooth extraction    . Tonsillectomy and adenoidectomy      age 89    Allergies: Codeine and Amoxicillin  Medications: Prior to Admission medications   Medication Sig Start Date End Date Taking? Authorizing Provider  cloNIDine (CATAPRES) 0.1 MG tablet Take 0.1 mg by mouth 2 (two) times daily.   Yes Historical Provider, MD  furosemide (LASIX) 40 MG tablet Take 80 mg by mouth 3 (three) times daily.   Yes Historical Provider, MD  hydroxychloroquine (PLAQUENIL) 200 MG tablet Take 200 mg by mouth 2 (two) times daily.    Yes Historical Provider, MD  mycophenolate (MYFORTIC) 180 MG EC tablet Take 1 tablet (180 mg total) by mouth 2 (two) times daily. 10/13/13  Yes Lenny Pastelavid Zeyfang, PA-C  predniSONE (DELTASONE) 10 MG tablet Take 10 mg by mouth 2 (two) times daily. Take 20 mg by mouth twice daily for 2 days, then take 10 mg by mouth twice daily for 2 days. 10/11/13  Yes Historical Provider, MD  ranitidine (ZANTAC) 150 MG tablet Take 150 mg by mouth daily.   Yes Historical Provider, MD  telmisartan (MICARDIS) 80 MG tablet Take 80 mg by mouth daily.   Yes Historical  Provider, MD     History reviewed. No pertinent family history.  Social History   Social History  . Marital Status: Married    Spouse Name: N/A  . Number of Children: N/A  . Years of Education: N/A   Social History Main Topics  . Smoking status: Former Smoker -- 6 years    Quit date: 09/07/2013  . Smokeless tobacco: None  . Alcohol Use: 0.5 oz/week    1 drink(s) per week  . Drug Use: No  . Sexual Activity: Not Asked   Other Topics Concern  . None   Social History Narrative      Review of Systems: A 12 point ROS discussed and pertinent positives are indicated in the HPI above.  All other systems are negative.  Review of Systems  Constitutional: Positive for fatigue and unexpected weight change. Negative for fever, activity change and appetite change.  HENT: Positive for facial swelling.   Respiratory: Negative for shortness of breath.   Neurological: Negative for weakness.  Psychiatric/Behavioral: Negative for behavioral problems and confusion.    Vital Signs: BP 172/115 mmHg  Pulse 56  Temp(Src) 97.8 F (36.6 C) (Oral)  Resp 18  Ht 5\' 6"  (1.676 m)  Wt 202 lb (91.627 kg)  BMI 32.62 kg/m2  SpO2 100%  LMP 07/11/2015  Physical Exam  Constitutional: She is oriented to person, place, and time.  Cardiovascular: Normal rate,  regular rhythm and normal heart sounds.   Pulmonary/Chest: Effort normal and breath sounds normal.  Abdominal: Soft. Bowel sounds are normal. There is no tenderness.  Musculoskeletal: Normal range of motion. She exhibits edema.  Neurological: She is alert and oriented to person, place, and time.  Skin: Skin is warm and dry.  Psychiatric: She has a normal mood and affect. Her behavior is normal. Judgment and thought content normal.  Nursing note and vitals reviewed.   Mallampati Score:  MD Evaluation Airway: WNL Heart: WNL Abdomen: WNL Chest/ Lungs: WNL ASA  Classification: 3 Mallampati/Airway Score: One  Imaging: No results  found.  Labs:  CBC:  Recent Labs  07/15/15 0620  WBC 6.2  HGB 9.9*  HCT 31.9*  PLT 339    COAGS:  Recent Labs  07/15/15 0620  INR 0.90  APTT 27    BMP: No results for input(s): NA, K, CL, CO2, GLUCOSE, BUN, CALCIUM, CREATININE, GFRNONAA, GFRAA in the last 8760 hours.  Invalid input(s): CMP  LIVER FUNCTION TESTS: No results for input(s): BILITOT, AST, ALT, ALKPHOS, PROT, ALBUMIN in the last 8760 hours.  TUMOR MARKERS: No results for input(s): AFPTM, CEA, CA199, CHROMGRNA in the last 8760 hours.  Assessment and Plan:  Lupus since age 22 Lupus nephritis with increase in proteinuria and hematuria Now scheduled for random renal bx Risks and Benefits discussed with the patient including, but not limited to bleeding, infection, damage to adjacent structures or low yield requiring additional tests. All of the patient's questions were answered, patient is agreeable to proceed. Consent signed and in chart.  BP high this am----Dr Deanne Coffer aware Monitoring and he will decide on bx  Thank you for this interesting consult.  I greatly enjoyed meeting VALLORIE NICCOLI and look forward to participating in their care.  A copy of this report was sent to the requesting provider on this date.  Electronically Signed: Skylie Hiott A 07/15/2015, 8:00 AM   I spent a total of  30 Minutes   in face to face in clinical consultation, greater than 50% of which was counseling/coordinating care for random renal bx

## 2015-07-15 NOTE — Progress Notes (Signed)
Awake and states no headache or nausea and ready to go home

## 2015-07-15 NOTE — Sedation Documentation (Signed)
Pt grimacing 

## 2015-07-15 NOTE — Sedation Documentation (Signed)
Patient is resting comfortably. 

## 2015-07-15 NOTE — Discharge Instructions (Signed)
Kidney Biopsy, Care After °Refer to this sheet in the next few weeks. These instructions provide you with information on caring for yourself after your procedure. Your health care provider may also give you more specific instructions. Your treatment has been planned according to current medical practices, but problems sometimes occur. Call your health care provider if you have any problems or questions after your procedure.  °WHAT TO EXPECT AFTER THE PROCEDURE  °· You may notice blood in the urine for the first 24 hours after the biopsy. °· You may feel some pain at the biopsy site for 1-2 weeks after the biopsy. °HOME CARE INSTRUCTIONS °· Do not lift anything heavier than 10 lb (4.5 kg) for 2 weeks. °· Do not take any non-steroidal anti-inflammatory drugs (NSAIDs) or any blood thinners for a week after the biopsy unless instructed to do so by your health care provider. °· Only take medicines for pain, fever, or discomfort as directed by your health care provider. °SEEK MEDICAL CARE IF: °· You have bloody urine more than 24 hours after the biopsy.   °· You develop a fever.   °· You cannot urinate.   °· You have increasing pain at the biopsy site.   °SEEK IMMEDIATE MEDICAL CARE IF: °You feel faint or dizzy.  °  °This information is not intended to replace advice given to you by your health care provider. Make sure you discuss any questions you have with your health care provider. °  °Document Released: 09/28/2012 Document Reviewed: 09/28/2012 °Elsevier Interactive Patient Education ©2016 [employer] ° °

## 2015-07-29 ENCOUNTER — Encounter (HOSPITAL_COMMUNITY): Payer: Self-pay

## 2015-10-11 IMAGING — US US BIOPSY
1 series · 6 of 6 positions shown · non-contrast
Comparison: none

[Series 1: us biopsy · 0.21mm/px · 6 of 6 slices shown]
[im 1/6]
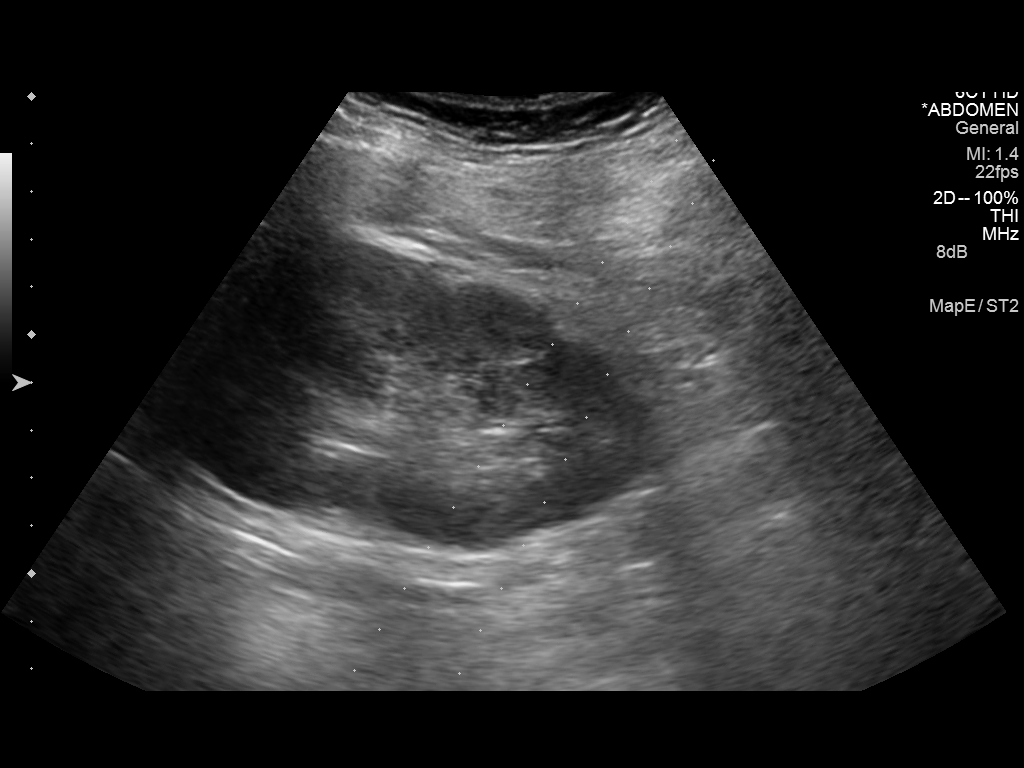
[im 2/6]
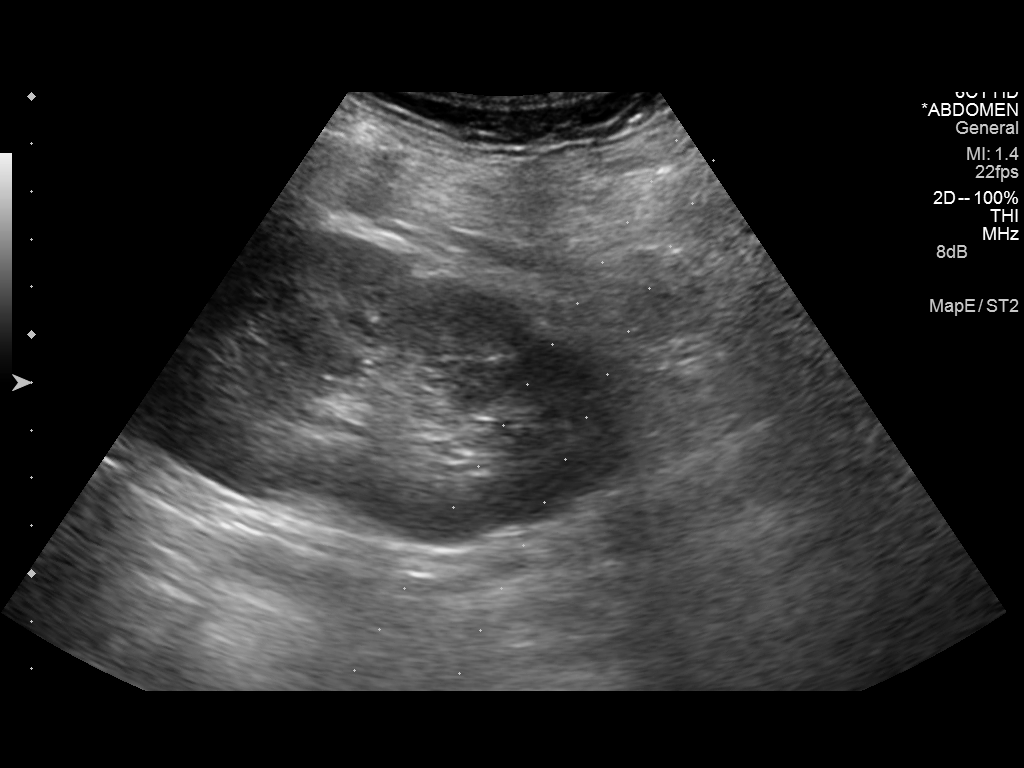
[im 3/6]
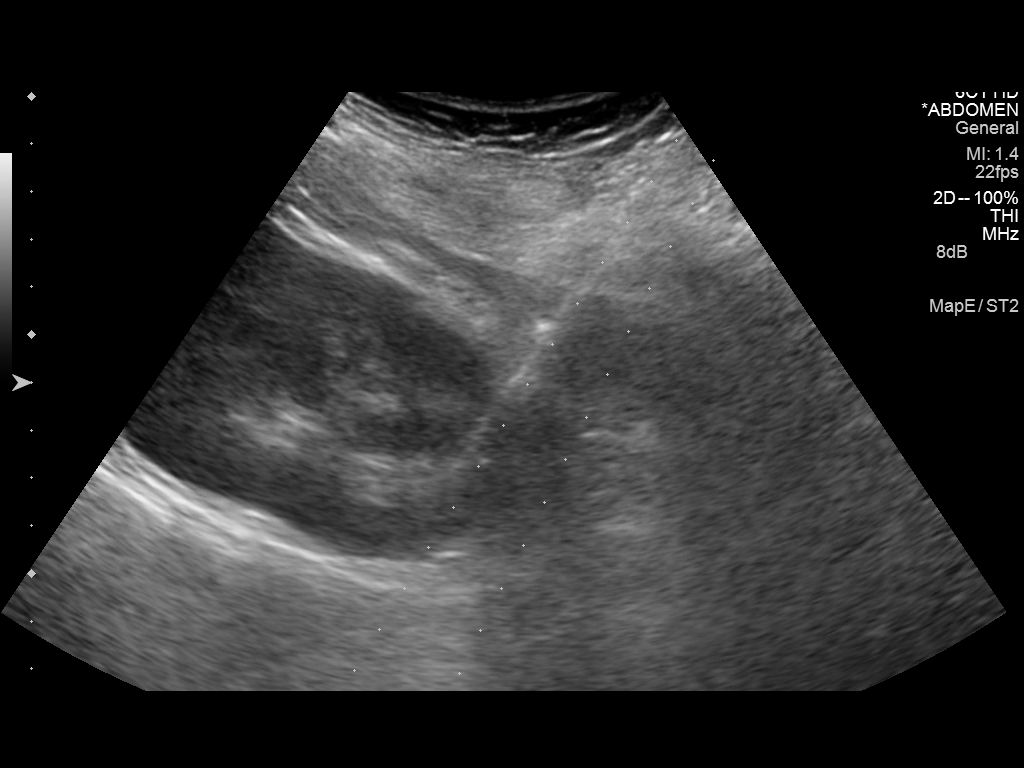
[im 4/6]
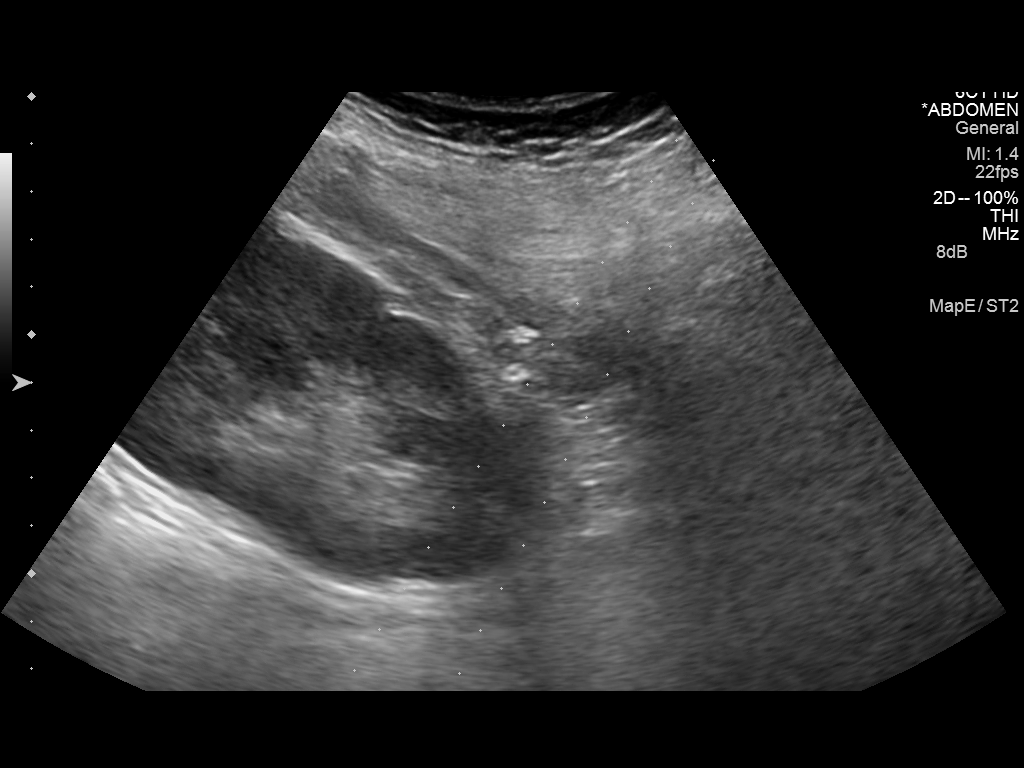
[im 5/6]
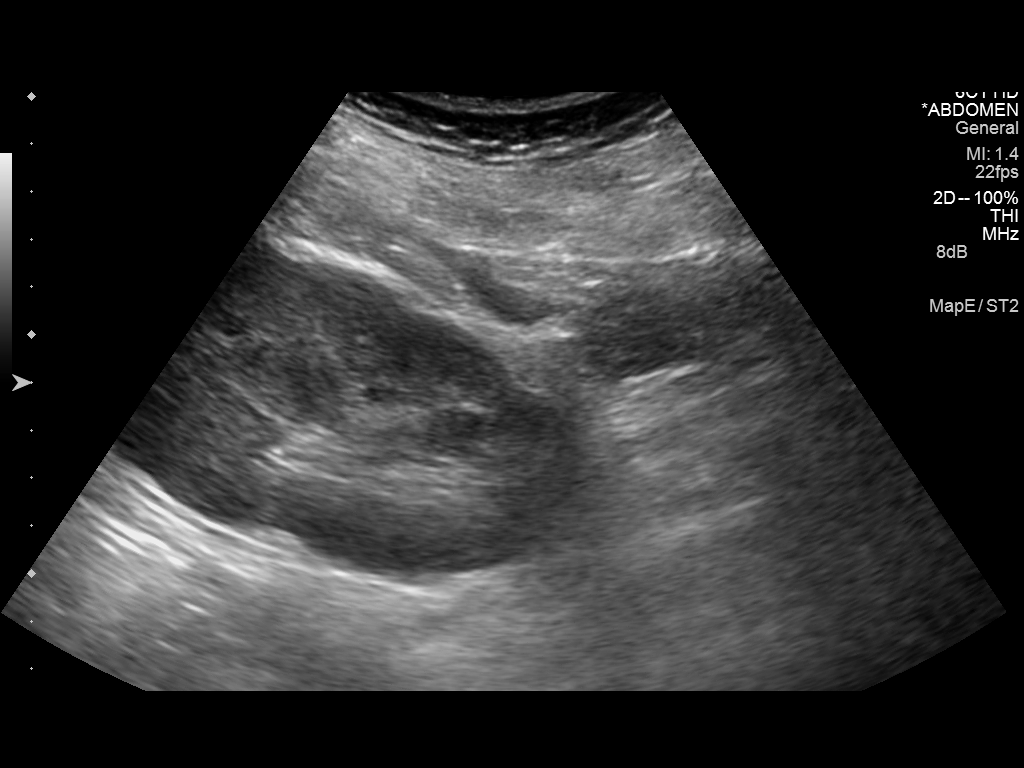
[im 6/6]
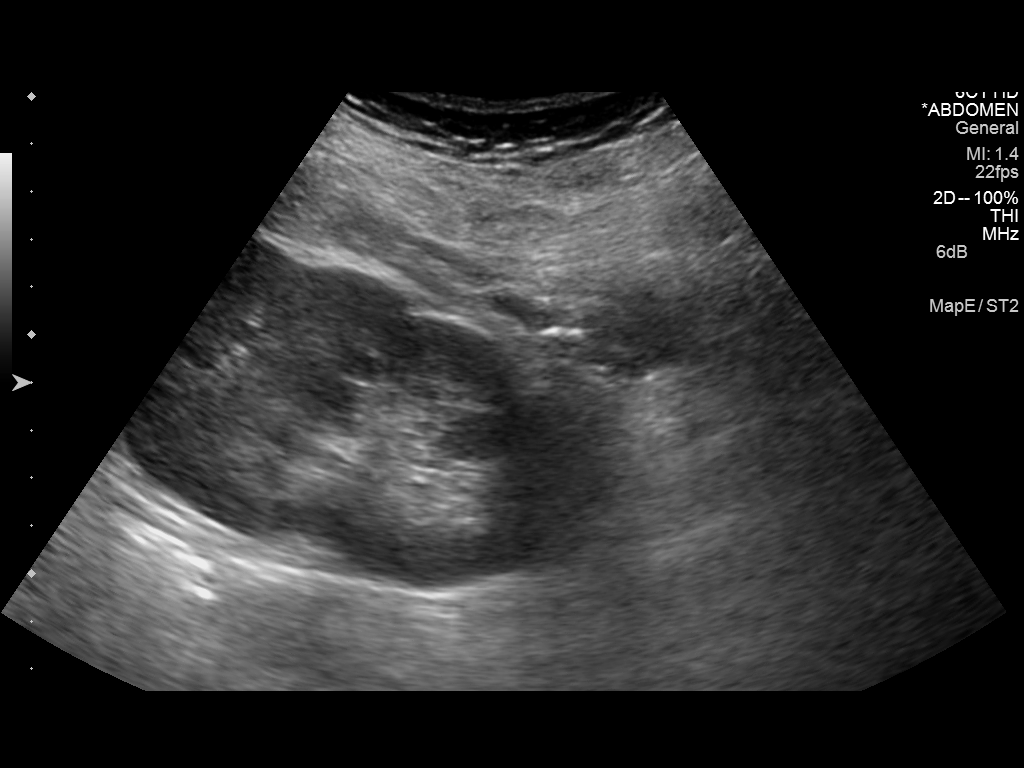

[6 of 6 positions shown; findings below may reference images not displayed]

Canned report from images found in remote index.

Refer to host system for actual result text.

## 2016-02-26 ENCOUNTER — Encounter (HOSPITAL_COMMUNITY): Payer: BLUE CROSS/BLUE SHIELD

## 2016-03-04 ENCOUNTER — Encounter (HOSPITAL_COMMUNITY): Payer: BLUE CROSS/BLUE SHIELD

## 2016-04-07 ENCOUNTER — Other Ambulatory Visit (HOSPITAL_COMMUNITY)
Admission: RE | Admit: 2016-04-07 | Discharge: 2016-04-07 | Disposition: A | Discharge status: Home / Self Care | Payer: BLUE CROSS/BLUE SHIELD | Source: Ambulatory Visit | Attending: Plastic Surgery | Admitting: Plastic Surgery

## 2016-04-07 DIAGNOSIS — L03113 Cellulitis of right upper limb: Secondary | ICD-10-CM | POA: Diagnosis not present

## 2016-04-07 LAB — CBC WITH DIFFERENTIAL/PLATELET
BASOS ABS: 0 10*3/uL (ref 0.0–0.1)
Basophils Relative: 0 %
EOS PCT: 2 %
Eosinophils Absolute: 0.2 10*3/uL (ref 0.0–0.7)
HEMATOCRIT: 28 % — AB (ref 36.0–46.0)
Hemoglobin: 9.2 g/dL — ABNORMAL LOW (ref 12.0–15.0)
LYMPHS ABS: 1.2 10*3/uL (ref 0.7–4.0)
LYMPHS PCT: 14 %
MCH: 24.7 pg — AB (ref 26.0–34.0)
MCHC: 32.9 g/dL (ref 30.0–36.0)
MCV: 75.1 fL — AB (ref 78.0–100.0)
MONO ABS: 0.9 10*3/uL (ref 0.1–1.0)
Monocytes Relative: 10 %
NEUTROS ABS: 6.4 10*3/uL (ref 1.7–7.7)
Neutrophils Relative %: 74 %
PLATELETS: 284 10*3/uL (ref 150–400)
RBC: 3.73 MIL/uL — ABNORMAL LOW (ref 3.87–5.11)
RDW: 14.6 % (ref 11.5–15.5)
WBC: 8.6 10*3/uL (ref 4.0–10.5)

## 2016-04-07 LAB — VANCOMYCIN, RANDOM: VANCOMYCIN RM: 20

## 2016-04-07 LAB — SEDIMENTATION RATE: Sed Rate: 24 mm/hr — ABNORMAL HIGH (ref 0–22)

## 2016-04-07 LAB — CREATININE, SERUM
Creatinine, Ser: 1.79 mg/dL — ABNORMAL HIGH (ref 0.44–1.00)
GFR calc non Af Amer: 37 mL/min — ABNORMAL LOW (ref >60)
GFR, EST AFRICAN AMERICAN: 43 mL/min — AB (ref >60)

## 2016-04-07 LAB — C-REACTIVE PROTEIN: CRP: 1 mg/dL — AB (ref <1.0)

## 2016-04-07 LAB — BUN: BUN: 19 mg/dL (ref 6–20)

## 2016-04-10 ENCOUNTER — Other Ambulatory Visit (HOSPITAL_COMMUNITY)
Admission: RE | Admit: 2016-04-10 | Discharge: 2016-04-10 | Disposition: A | Discharge status: Home / Self Care | Payer: BLUE CROSS/BLUE SHIELD | Source: Other Acute Inpatient Hospital | Attending: Plastic Surgery | Admitting: Plastic Surgery

## 2016-04-10 DIAGNOSIS — L03113 Cellulitis of right upper limb: Secondary | ICD-10-CM | POA: Insufficient documentation

## 2016-04-10 LAB — VANCOMYCIN, TROUGH: Vancomycin Tr: 21 ug/mL (ref 15–20)

## 2016-04-14 ENCOUNTER — Other Ambulatory Visit (HOSPITAL_COMMUNITY)
Admission: RE | Admit: 2016-04-14 | Discharge: 2016-04-14 | Disposition: A | Discharge status: Home / Self Care | Payer: BLUE CROSS/BLUE SHIELD | Source: Other Acute Inpatient Hospital | Attending: Plastic Surgery | Admitting: Plastic Surgery

## 2016-04-14 DIAGNOSIS — L0390 Cellulitis, unspecified: Secondary | ICD-10-CM | POA: Diagnosis not present

## 2016-04-14 LAB — CBC WITH DIFFERENTIAL/PLATELET
BASOS PCT: 0 %
Basophils Absolute: 0 10*3/uL (ref 0.0–0.1)
Eosinophils Absolute: 0.4 10*3/uL (ref 0.0–0.7)
Eosinophils Relative: 4 %
HEMATOCRIT: 27.8 % — AB (ref 36.0–46.0)
Hemoglobin: 8.9 g/dL — ABNORMAL LOW (ref 12.0–15.0)
LYMPHS PCT: 11 %
Lymphs Abs: 1 10*3/uL (ref 0.7–4.0)
MCH: 24.7 pg — ABNORMAL LOW (ref 26.0–34.0)
MCHC: 32 g/dL (ref 30.0–36.0)
MCV: 77 fL — AB (ref 78.0–100.0)
MONO ABS: 0.5 10*3/uL (ref 0.1–1.0)
Monocytes Relative: 6 %
NEUTROS ABS: 7.7 10*3/uL (ref 1.7–7.7)
Neutrophils Relative %: 79 %
Platelets: 290 10*3/uL (ref 150–400)
RBC: 3.61 MIL/uL — ABNORMAL LOW (ref 3.87–5.11)
RDW: 15.4 % (ref 11.5–15.5)
WBC: 9.8 10*3/uL (ref 4.0–10.5)

## 2016-04-14 LAB — CREATININE, SERUM
Creatinine, Ser: 2.13 mg/dL — ABNORMAL HIGH (ref 0.44–1.00)
GFR calc Af Amer: 35 mL/min — ABNORMAL LOW (ref >60)
GFR calc non Af Amer: 30 mL/min — ABNORMAL LOW (ref >60)

## 2016-04-14 LAB — BUN: BUN: 35 mg/dL — ABNORMAL HIGH (ref 6–20)

## 2016-04-14 LAB — SEDIMENTATION RATE: SED RATE: 15 mm/h (ref 0–22)

## 2016-04-14 LAB — C-REACTIVE PROTEIN: CRP: 1.1 mg/dL — ABNORMAL HIGH (ref <1.0)

## 2019-11-19 ENCOUNTER — Emergency Department (HOSPITAL_BASED_OUTPATIENT_CLINIC_OR_DEPARTMENT_OTHER): Payer: BC Managed Care – PPO

## 2019-11-19 ENCOUNTER — Emergency Department (HOSPITAL_COMMUNITY)
Admission: EM | Admit: 2019-11-19 | Discharge: 2019-11-20 | Disposition: A | Discharge status: Home / Self Care | Payer: BC Managed Care – PPO | Attending: Emergency Medicine | Admitting: Emergency Medicine

## 2019-11-19 ENCOUNTER — Encounter (HOSPITAL_COMMUNITY): Payer: Self-pay | Admitting: Emergency Medicine

## 2019-11-19 ENCOUNTER — Other Ambulatory Visit: Payer: Self-pay

## 2019-11-19 DIAGNOSIS — Z87891 Personal history of nicotine dependence: Secondary | ICD-10-CM | POA: Insufficient documentation

## 2019-11-19 DIAGNOSIS — Z79899 Other long term (current) drug therapy: Secondary | ICD-10-CM | POA: Diagnosis not present

## 2019-11-19 DIAGNOSIS — I82890 Acute embolism and thrombosis of other specified veins: Secondary | ICD-10-CM

## 2019-11-19 DIAGNOSIS — R21 Rash and other nonspecific skin eruption: Secondary | ICD-10-CM | POA: Diagnosis present

## 2019-11-19 DIAGNOSIS — M79605 Pain in left leg: Secondary | ICD-10-CM | POA: Diagnosis not present

## 2019-11-19 DIAGNOSIS — I10 Essential (primary) hypertension: Secondary | ICD-10-CM | POA: Diagnosis not present

## 2019-11-19 NOTE — ED Triage Notes (Signed)
Emergency Medicine Provider Triage Evaluation Note  Ariel Johns , a 34 y.o. female  was evaluated in triage.  Pt complains of left leg redness/ecchymosis x 1 week.She reports she has hx of varicose veins and hit a varicose vein on her lower left leg x 1 months ago against a box. She reports she felt it burst at that point and she had a knot and bruising however did not think much of it until she noticed bruising and redness to her medial upper thigh this past week with pain. No hx of DVT in the past however pt does have a hx of Lupus. No chest pain or shortness of breath.   Review of Systems  Positive: + color change, + arthralgias Negative: - chest pain, - SOB  Physical Exam  BP 131/78 (BP Location: Left Arm)   Pulse 80   Temp 98.2 F (36.8 C) (Oral)   Resp 16   LMP 10/22/2019   SpO2 100%  Gen:   Awake, no distress   HEENT:  Atraumatic  Resp:  Normal effort  Cardiac:  Normal rate  Abd:   Nondistended, nontender  MSK:   Moves extremities without difficulty. + Area of erythema and increased warmth to the left medial upper thigh with palpable knot and TTP. Varicose veins present to BLEs. 2+ DP pulse bilaterally.  Neuro:  Speech clear   Medical Decision Making  Medically screening exam initiated at 6:25 PM.  Appropriate orders placed.  PAIGE MONARREZ was informed that the remainder of the evaluation will be completed by another provider, this initial triage assessment does not replace that evaluation, and the importance of remaining in the ED until their evaluation is complete.  Clinical Impression  Nursing staff asked me to evaluate patient in triage for possible DVT study prior to vascular tech leaving for the day. There is concern for possible DVT vs superficial thrombophlebitis. DVT study has been ordered and pt placed back into the waiting room.    Tanda Rockers, PA-C 11/19/19 1828

## 2019-11-19 NOTE — ED Notes (Signed)
Pt stepped outside.  

## 2019-11-19 NOTE — ED Triage Notes (Signed)
Pt states she hit a varicose vein on her lower L lateral leg on a box 2 months ago.  States she had bruising to leg that spread upward.  Over the last week she has had redness and bruising to L thigh that is warm to touch.   PA at triage to assess for need for vascular US.

## 2019-11-19 NOTE — Progress Notes (Signed)
VASCULAR LAB    Left lower extremity venous duplex has been performed.  See CV proc for preliminary results.  Gave verbal results to Fidel Levy, RN  Rosezetta Schlatter, Josue Falconi, RVT 11/19/2019, 6:29 PM

## 2019-11-20 MED ORDER — DICLOFENAC SODIUM 1 % EX GEL
2.0000 g | Freq: Once | CUTANEOUS | Status: AC
  Administered 2019-11-20: 2 g via TOPICAL
  Filled 2019-11-20: qty 100

## 2019-11-20 NOTE — ED Provider Notes (Signed)
[facility] EMERGENCY DEPARTMENT Provider Note   CSN: [id number] Arrival date & time: 11/19/19  1658     History Chief Complaint  Patient presents with  . Leg Pain    Ariel Johns is a 34 y.o. female.  Pt is a 34 yo female with a past history of lupus nephritis and raynaud disease who presented today for evaluation of a warm, red rash on her left leg. Pt stated that she first noticed the rash 1-2 months ago on the lateral aspect of her left lower leg after an injury to this area. She states that she hit a varicose vein on a box and thought it ruptures, she developed some bruising and a knot in this area. Since she first noticed the rash it has traveled up the lateral aspect of her left thigh and crossed medially. Pt reports tenderness to palpation of the rash and warmth to touch. Pt also reported pain with movement of the extremity, nothing makes the pain better. Pt reports she is unable to take NSAIDs due to her history of lupus nephritis. She has no history of blood clots, denies shortness of breath, cough, chest pain, fevers, chills, nausea, vomiting or abdominal pain. Pt denies any history of recent immobilization or surgeries.        Past Medical History:  Diagnosis Date  . GERD (gastroesophageal reflux disease)   . Hypertension   . Lupus nephritis (HCC) 2009   diagnosed at age 8  . Raynaud disease     Patient Active Problem List   Diagnosis Date Noted  . Nephrotic syndrome 10/12/2013    Past Surgical History:  Procedure Laterality Date  . lymphnode biopsy    . RENAL BIOPSY    . TONSILLECTOMY AND ADENOIDECTOMY     age 55  . WISDOM TOOTH EXTRACTION       OB History   No obstetric history on file.     No family history on file.  Social History   Tobacco Use  . Smoking status: Former Smoker    Years: 6.00    Quit date: 09/07/2013    Years since quitting: 6.2  Substance Use Topics  . Alcohol use: Yes    Alcohol/week: 1.0  standard drink    Types: 1 drink(s) per week  . Drug use: No    Home Medications Prior to Admission medications   Medication Sig Start Date End Date Taking? Authorizing Provider  cloNIDine (CATAPRES) 0.1 MG tablet Take 0.1 mg by mouth 2 (two) times daily.    [provider]  furosemide (LASIX) 40 MG tablet Take 80 mg by mouth 3 (three) times daily.    [provider]  hydroxychloroquine (PLAQUENIL) 200 MG tablet Take 200 mg by mouth 2 (two) times daily.     [provider]  mycophenolate (MYFORTIC) 180 MG EC tablet Take 1 tablet (180 mg total) by mouth 2 (two) times daily. 10/13/13   Lenny Pastel, PA-C  predniSONE (DELTASONE) 10 MG tablet Take 10 mg by mouth 2 (two) times daily. Take 20 mg by mouth twice daily for 2 days, then take 10 mg by mouth twice daily for 2 days. 10/11/13   [provider]  ranitidine (ZANTAC) 150 MG tablet Take 150 mg by mouth daily.    [provider]  telmisartan (MICARDIS) 80 MG tablet Take 80 mg by mouth daily.    [provider]    Allergies    Codeine and Amoxicillin  Review of Systems  Review of Systems  Constitutional: Negative for chills and fever.  Respiratory: Negative for cough and shortness of breath.   Cardiovascular: Positive for leg swelling. Negative for chest pain.  Musculoskeletal: Negative for arthralgias, joint swelling and myalgias.  Skin: Positive for color change and rash.  Neurological: Negative for weakness and numbness.    Physical Exam Updated Vital Signs BP 133/80 (BP Location: Left Arm)   Pulse 74   Temp 98 F (36.7 C) (Oral)   Resp 18   LMP 10/22/2019   SpO2 100%   Physical Exam Vitals and nursing note reviewed.  Constitutional:      General: She is not in acute distress.    Appearance: Normal appearance. She is well-developed. She is not ill-appearing or diaphoretic.  HENT:     Head: Normocephalic and atraumatic.  Eyes:     General:        Right eye: No  discharge.        Left eye: No discharge.  Cardiovascular:     Pulses: Normal pulses.  Pulmonary:     Effort: Pulmonary effort is normal. No respiratory distress.  Musculoskeletal:        General: No swelling or deformity.     Cervical back: Neck supple.     Right lower leg: No edema.     Left lower leg: No edema.     Comments: Area of erythema and warmth beginning along the left lateral knee and tracking up and across the thigh towards the medial groin, this area is mildly tender to palpation, no surrounding swelling, no pain or tenderness in the lower leg.  Distal pulses 2+, good cap refill, normal range of motion throughout the leg.  Skin:    General: Skin is warm and dry.  Neurological:     Mental Status: She is alert and oriented to person, place, and time.     Coordination: Coordination normal.  Psychiatric:        Mood and Affect: Mood normal.        Behavior: Behavior normal.     ED Results / Procedures / Treatments   Labs (all labs ordered are listed, but only abnormal results are displayed) Labs Reviewed - No data to display  EKG None  Radiology VAS [place] LOWER EXTREMITY VENOUS (DVT) ([facility] and [facility] 7a-7p)  Result Date: 11/19/2019  Lower Venous DVT Study Indications: Palpable Cord, and pain, bruising.  Comparison Study: No prior study on file Performing Technologist: Sherren Kerns RVS  Examination Guidelines: A complete evaluation includes B-mode imaging, spectral Doppler, color Doppler, and power Doppler as needed of all accessible portions of each vessel. Bilateral testing is considered an integral part of a complete examination. Limited examinations for reoccurring indications may be performed as noted. The reflux portion of the exam is performed with the patient in reverse Trendelenburg.  +-----+---------------+---------+-----------+----------+--------------+ RIGHTCompressibilityPhasicitySpontaneityPropertiesThrombus Aging  +-----+---------------+---------+-----------+----------+--------------+ CFV  Full           Yes      Yes                                 +-----+---------------+---------+-----------+----------+--------------+   +---------+---------------+---------+-----------+----------+--------------+ LEFT     CompressibilityPhasicitySpontaneityPropertiesThrombus Aging +---------+---------------+---------+-----------+----------+--------------+ CFV      Full           Yes      Yes                                 +---------+---------------+---------+-----------+----------+--------------+  SFJ      Full                                                        +---------+---------------+---------+-----------+----------+--------------+ FV Prox  Full                                                        +---------+---------------+---------+-----------+----------+--------------+ FV Mid   Full                                                        +---------+---------------+---------+-----------+----------+--------------+ FV DistalFull                                                        +---------+---------------+---------+-----------+----------+--------------+ PFV      Full                                                        +---------+---------------+---------+-----------+----------+--------------+ POP      Full           Yes      Yes                                 +---------+---------------+---------+-----------+----------+--------------+ PTV      Full                                                        +---------+---------------+---------+-----------+----------+--------------+ PERO     Full                                                        +---------+---------------+---------+-----------+----------+--------------+ superficial trombosis noted in a tortuous varicosity in the left thigh at site of knotting.    Summary: RIGHT: - No evidence of common  femoral vein obstruction.  LEFT: - Findings consistent with acute superficial vein thrombosis involving the left varicosities. - There is no evidence of deep vein thrombosis in the lower extremity.  *See table(s) above for measurements and observations.    Preliminary     Procedures Procedures (including critical care time)  Medications Ordered in ED Medications  diclofenac Sodium (VOLTAREN) 1 % topical gel 2 g (has no administration in time range)    ED Course  I have reviewed the triage vital signs  and the nursing notes.  Pertinent labs & imaging results that were available during my care of the patient were reviewed by me and considered in my medical decision making (see chart for details).    MDM Rules/Calculators/A&P                          34 year old female presents with redness and pain over her left leg that has been present now for a few weeks, initially started after she hit her leg and thought she had a varicose vein that burst.  DVT study ordered from triage which shows no evidence of deep vein thromboses but does show an acute superficial vein thrombosis involving the left varicosities, which is consistent with what is seen on exam, and erythematous and warm rash tracking across the superficial veins on the left thigh.  Patient has a history of lupus nephritis and is not able to take systemic NSAIDs, will treat with Voltaren gel and warm compresses.  PCP follow-up encouraged and return precautions discussed.  Patient expresses understanding and agreement.  Discharged home in good condition.  Final Clinical Impression(s) / ED Diagnoses Final diagnoses:  Left leg pain    Rx / DC Orders ED Discharge Orders    None       Legrand RamsFord, Cheyann Blecha N, PA-C 11/20/19 [id number]    Palumbo, April, MD 11/21/19 [phone]

## 2019-11-20 NOTE — Discharge Instructions (Addendum)
Your ultrasound shows a superficial vein thrombosis, but no evidence of a deep vein thrombosis.  Treat with topical Voltaren gel and warm compresses over the area.  Please follow-up closely with your primary care doctor to ensure this is improving.  Return for new or worsening symptoms.

## 2022-12-16 ENCOUNTER — Encounter (INDEPENDENT_AMBULATORY_CARE_PROVIDER_SITE_OTHER): Payer: No Typology Code available for payment source | Admitting: Ophthalmology

## 2022-12-16 DIAGNOSIS — Z79899 Other long term (current) drug therapy: Secondary | ICD-10-CM | POA: Diagnosis not present

## 2022-12-16 DIAGNOSIS — M329 Systemic lupus erythematosus, unspecified: Secondary | ICD-10-CM

## 2022-12-16 DIAGNOSIS — H43813 Vitreous degeneration, bilateral: Secondary | ICD-10-CM

## 2022-12-16 DIAGNOSIS — H2513 Age-related nuclear cataract, bilateral: Secondary | ICD-10-CM

## 2023-05-22 ENCOUNTER — Inpatient Hospital Stay (HOSPITAL_BASED_OUTPATIENT_CLINIC_OR_DEPARTMENT_OTHER)
Admission: EM | Admit: 2023-05-22 | Discharge: 2023-05-24 | DRG: 683 | Disposition: A | Discharge status: Home / Self Care | Attending: Internal Medicine | Admitting: Internal Medicine

## 2023-05-22 ENCOUNTER — Other Ambulatory Visit: Payer: Self-pay

## 2023-05-22 ENCOUNTER — Encounter (HOSPITAL_BASED_OUTPATIENT_CLINIC_OR_DEPARTMENT_OTHER): Payer: Self-pay

## 2023-05-22 DIAGNOSIS — Z881 Allergy status to other antibiotic agents status: Secondary | ICD-10-CM | POA: Diagnosis not present

## 2023-05-22 DIAGNOSIS — I129 Hypertensive chronic kidney disease with stage 1 through stage 4 chronic kidney disease, or unspecified chronic kidney disease: Secondary | ICD-10-CM | POA: Diagnosis not present

## 2023-05-22 DIAGNOSIS — E872 Acidosis, unspecified: Secondary | ICD-10-CM | POA: Diagnosis not present

## 2023-05-22 DIAGNOSIS — I7300 Raynaud's syndrome without gangrene: Secondary | ICD-10-CM | POA: Diagnosis not present

## 2023-05-22 DIAGNOSIS — N1831 Chronic kidney disease, stage 3a: Secondary | ICD-10-CM | POA: Diagnosis present

## 2023-05-22 DIAGNOSIS — Z6838 Body mass index (BMI) 38.0-38.9, adult: Secondary | ICD-10-CM

## 2023-05-22 DIAGNOSIS — E860 Dehydration: Secondary | ICD-10-CM | POA: Diagnosis present

## 2023-05-22 DIAGNOSIS — N179 Acute kidney failure, unspecified: Secondary | ICD-10-CM | POA: Diagnosis not present

## 2023-05-22 DIAGNOSIS — E876 Hypokalemia: Secondary | ICD-10-CM | POA: Diagnosis present

## 2023-05-22 DIAGNOSIS — M329 Systemic lupus erythematosus, unspecified: Secondary | ICD-10-CM | POA: Diagnosis present

## 2023-05-22 DIAGNOSIS — E861 Hypovolemia: Secondary | ICD-10-CM | POA: Diagnosis present

## 2023-05-22 DIAGNOSIS — M069 Rheumatoid arthritis, unspecified: Secondary | ICD-10-CM | POA: Diagnosis not present

## 2023-05-22 DIAGNOSIS — Z880 Allergy status to penicillin: Secondary | ICD-10-CM

## 2023-05-22 DIAGNOSIS — Z885 Allergy status to narcotic agent status: Secondary | ICD-10-CM

## 2023-05-22 DIAGNOSIS — D649 Anemia, unspecified: Secondary | ICD-10-CM | POA: Diagnosis not present

## 2023-05-22 DIAGNOSIS — A020 Salmonella enteritis: Secondary | ICD-10-CM | POA: Diagnosis present

## 2023-05-22 DIAGNOSIS — R197 Diarrhea, unspecified: Secondary | ICD-10-CM

## 2023-05-22 DIAGNOSIS — Z87441 Personal history of nephrotic syndrome: Secondary | ICD-10-CM | POA: Diagnosis not present

## 2023-05-22 DIAGNOSIS — D84821 Immunodeficiency due to drugs: Secondary | ICD-10-CM

## 2023-05-22 DIAGNOSIS — M3214 Glomerular disease in systemic lupus erythematosus: Secondary | ICD-10-CM | POA: Diagnosis not present

## 2023-05-22 DIAGNOSIS — Z87891 Personal history of nicotine dependence: Secondary | ICD-10-CM | POA: Diagnosis not present

## 2023-05-22 DIAGNOSIS — Z7952 Long term (current) use of systemic steroids: Secondary | ICD-10-CM | POA: Diagnosis not present

## 2023-05-22 DIAGNOSIS — K219 Gastro-esophageal reflux disease without esophagitis: Secondary | ICD-10-CM | POA: Diagnosis not present

## 2023-05-22 DIAGNOSIS — R112 Nausea with vomiting, unspecified: Principal | ICD-10-CM

## 2023-05-22 DIAGNOSIS — R748 Abnormal levels of other serum enzymes: Secondary | ICD-10-CM

## 2023-05-22 DIAGNOSIS — E66812 Obesity, class 2: Secondary | ICD-10-CM | POA: Diagnosis not present

## 2023-05-22 DIAGNOSIS — E871 Hypo-osmolality and hyponatremia: Secondary | ICD-10-CM | POA: Diagnosis present

## 2023-05-22 DIAGNOSIS — Z79899 Other long term (current) drug therapy: Secondary | ICD-10-CM

## 2023-05-22 DIAGNOSIS — T380X5A Adverse effect of glucocorticoids and synthetic analogues, initial encounter: Secondary | ICD-10-CM

## 2023-05-22 LAB — CBC WITH DIFFERENTIAL/PLATELET
Abs Immature Granulocytes: 0.03 10*3/uL (ref 0.00–0.07)
Basophils Absolute: 0 10*3/uL (ref 0.0–0.1)
Basophils Relative: 0 %
Eosinophils Absolute: 0.1 10*3/uL (ref 0.0–0.5)
Eosinophils Relative: 1 %
HCT: 40.4 % (ref 36.0–46.0)
Hemoglobin: 14.1 g/dL (ref 12.0–15.0)
Immature Granulocytes: 0 %
Lymphocytes Relative: 10 %
Lymphs Abs: 0.7 10*3/uL (ref 0.7–4.0)
MCH: 26.6 pg (ref 26.0–34.0)
MCHC: 34.9 g/dL (ref 30.0–36.0)
MCV: 76.2 fL — ABNORMAL LOW (ref 80.0–100.0)
Monocytes Absolute: 0.6 10*3/uL (ref 0.1–1.0)
Monocytes Relative: 9 %
Neutro Abs: 5.7 10*3/uL (ref 1.7–7.7)
Neutrophils Relative %: 80 %
Platelets: 253 10*3/uL (ref 150–400)
RBC: 5.3 MIL/uL — ABNORMAL HIGH (ref 3.87–5.11)
RDW: 13.1 % (ref 11.5–15.5)
WBC: 7.1 10*3/uL (ref 4.0–10.5)
nRBC: 0 % (ref 0.0–0.2)

## 2023-05-22 LAB — BASIC METABOLIC PANEL WITH GFR
Anion gap: 16 — ABNORMAL HIGH (ref 5–15)
BUN: 71 mg/dL — ABNORMAL HIGH (ref 6–20)
CO2: 14 mmol/L — ABNORMAL LOW (ref 22–32)
Calcium: 8.4 mg/dL — ABNORMAL LOW (ref 8.9–10.3)
Chloride: 103 mmol/L (ref 98–111)
Creatinine, Ser: 5 mg/dL — ABNORMAL HIGH (ref 0.44–1.00)
GFR, Estimated: 11 mL/min — ABNORMAL LOW (ref >60)
Glucose, Bld: 95 mg/dL (ref 70–99)
Potassium: 3.5 mmol/L (ref 3.5–5.1)
Sodium: 133 mmol/L — ABNORMAL LOW (ref 135–145)

## 2023-05-22 LAB — URINALYSIS, COMPLETE (UACMP) WITH MICROSCOPIC
Bilirubin Urine: NEGATIVE
Glucose, UA: NEGATIVE mg/dL
Hgb urine dipstick: NEGATIVE
Ketones, ur: NEGATIVE mg/dL
Nitrite: NEGATIVE
Protein, ur: 30 mg/dL — AB
Specific Gravity, Urine: 1.011 (ref 1.005–1.030)
pH: 5 (ref 5.0–8.0)

## 2023-05-22 LAB — COMPREHENSIVE METABOLIC PANEL WITH GFR
ALT: 12 U/L (ref 0–44)
AST: 15 U/L (ref 15–41)
Albumin: 3.6 g/dL (ref 3.5–5.0)
Alkaline Phosphatase: 55 U/L (ref 38–126)
Anion gap: 14 (ref 5–15)
BUN: 74 mg/dL — ABNORMAL HIGH (ref 6–20)
CO2: 14 mmol/L — ABNORMAL LOW (ref 22–32)
Calcium: 9.3 mg/dL (ref 8.9–10.3)
Chloride: 105 mmol/L (ref 98–111)
Creatinine, Ser: 5.53 mg/dL — ABNORMAL HIGH (ref 0.44–1.00)
GFR, Estimated: 9 mL/min — ABNORMAL LOW (ref >60)
Glucose, Bld: 97 mg/dL (ref 70–99)
Potassium: 3.7 mmol/L (ref 3.5–5.1)
Sodium: 133 mmol/L — ABNORMAL LOW (ref 135–145)
Total Bilirubin: 0.3 mg/dL (ref 0.0–1.2)
Total Protein: 6.8 g/dL (ref 6.5–8.1)

## 2023-05-22 LAB — GLUCOSE, CAPILLARY
Glucose-Capillary: 58 mg/dL — ABNORMAL LOW (ref 70–99)
Glucose-Capillary: 64 mg/dL — ABNORMAL LOW (ref 70–99)
Glucose-Capillary: 96 mg/dL (ref 70–99)
Glucose-Capillary: 96 mg/dL (ref 70–99)
Glucose-Capillary: 107 mg/dL — ABNORMAL HIGH (ref 70–99)

## 2023-05-22 LAB — C DIFFICILE QUICK SCREEN W PCR REFLEX
C Diff antigen: NEGATIVE
C Diff interpretation: NOT DETECTED
C Diff toxin: NEGATIVE

## 2023-05-22 LAB — HIV ANTIBODY (ROUTINE TESTING W REFLEX): HIV Screen 4th Generation wRfx: NONREACTIVE

## 2023-05-22 MED ORDER — LACTATED RINGERS IV BOLUS
1000.0000 mL | Freq: Once | INTRAVENOUS | Status: AC
  Administered 2023-05-22: 1000 mL via INTRAVENOUS

## 2023-05-22 MED ORDER — SODIUM BICARBONATE 8.4 % IV SOLN
INTRAVENOUS | Status: DC
  Filled 2023-05-22: qty 1000
  Filled 2023-05-22: qty 150
  Filled 2023-05-22: qty 1000
  Filled 2023-05-22 (×2): qty 150
  Filled 2023-05-22 (×2): qty 1000

## 2023-05-22 MED ORDER — LACTATED RINGERS IV SOLN: INTRAVENOUS | Status: DC

## 2023-05-22 MED ORDER — ACETAMINOPHEN 650 MG RE SUPP: 650.0000 mg | Freq: Four times a day (QID) | RECTAL | Status: DC | PRN

## 2023-05-22 MED ORDER — METHYLPREDNISOLONE SODIUM SUCC 40 MG IJ SOLR
40.0000 mg | Freq: Every day | INTRAMUSCULAR | Status: DC
  Administered 2023-05-23: 40 mg via INTRAVENOUS
  Filled 2023-05-22: qty 1

## 2023-05-22 MED ORDER — ONDANSETRON HCL 4 MG PO TABS: 4.0000 mg | ORAL_TABLET | Freq: Four times a day (QID) | ORAL | Status: DC | PRN

## 2023-05-22 MED ORDER — ACETAMINOPHEN 325 MG PO TABS: 650.0000 mg | ORAL_TABLET | Freq: Four times a day (QID) | ORAL | Status: DC | PRN

## 2023-05-22 MED ORDER — HEPARIN SODIUM (PORCINE) 5000 UNIT/ML IJ SOLN
5000.0000 [IU] | Freq: Three times a day (TID) | INTRAMUSCULAR | Status: DC
Start: 2023-05-22 — End: 2023-05-24
  Administered 2023-05-23 – 2023-05-24 (×3): 5000 [IU] via SUBCUTANEOUS
  Filled 2023-05-22 (×3): qty 1

## 2023-05-22 MED ORDER — FAMOTIDINE 20 MG PO TABS
10.0000 mg | ORAL_TABLET | Freq: Every day | ORAL | Status: DC
  Administered 2023-05-22 – 2023-05-24 (×3): 10 mg via ORAL
  Filled 2023-05-22 (×3): qty 1

## 2023-05-22 MED ORDER — SODIUM CHLORIDE 0.45 % IV SOLN: INTRAVENOUS | Status: DC

## 2023-05-22 MED ORDER — ONDANSETRON HCL 4 MG/2ML IJ SOLN
4.0000 mg | Freq: Once | INTRAMUSCULAR | Status: AC
  Administered 2023-05-22: 4 mg via INTRAVENOUS
  Filled 2023-05-22: qty 2

## 2023-05-22 MED ORDER — PREDNISONE 5 MG PO TABS
5.0000 mg | ORAL_TABLET | Freq: Every day | ORAL | Status: DC
  Administered 2023-05-22: 5 mg via ORAL
  Filled 2023-05-22: qty 1

## 2023-05-22 MED ORDER — ONDANSETRON HCL 4 MG/2ML IJ SOLN: 4.0000 mg | Freq: Four times a day (QID) | INTRAMUSCULAR | Status: DC | PRN

## 2023-05-22 MED ORDER — ALBUTEROL SULFATE (2.5 MG/3ML) 0.083% IN NEBU: 2.5000 mg | INHALATION_SOLUTION | RESPIRATORY_TRACT | Status: DC | PRN

## 2023-05-22 NOTE — ED Triage Notes (Signed)
 She reports n/v/d since this Tuesday. She tells me her nausea and vomiting "are almost gone, but I keep having watery diarrhea". She denies fever and is in no distress.

## 2023-05-22 NOTE — Plan of Care (Signed)

## 2023-05-22 NOTE — ED Provider Notes (Signed)
 [place] EMERGENCY DEPARTMENT AT [facility] Provider Note   CSN: [id number] Arrival date & time: 05/22/23  9528     History  Chief Complaint  Patient presents with   Emesis   Diarrhea    Ariel Johns is a 38 y.o. female.  HPI 38 year old female history of SLE, on chronic immunosuppressants, history of hypertension and nephrotic syndrome who presents today complaining of nausea vomiting and diarrhea that began on Tuesday.  She began having multiple episodes of vomiting which has seemed to have resolved.  She now is having frequent diarrhea.  Continues to be nauseated have decreased p.o. intake.  She described the emesis as nonbloody and nonbilious.  She has some pus in her stool but no blood.  She had some subjective fever and chills on the day began but has not noted any since that time.  She has had no definite sick contacts.  She is now feeling lightheaded when she stands up.  She is having difficulty taking p.o. but describes drinking Gatorade.  She did have some grits yesterday.  She describes infrequent urination denies any pain or incontinence.  She had been taking medications as prescribed but has having difficulty keeping them down today.     Home Medications Prior to Admission medications   Medication Sig Start Date End Date Taking? Authorizing Provider  cloNIDine (CATAPRES) 0.1 MG tablet Take 0.1 mg by mouth 2 (two) times daily.    [provider]  furosemide (LASIX) 40 MG tablet Take 80 mg by mouth 3 (three) times daily.    [provider]  hydroxychloroquine (PLAQUENIL) 200 MG tablet Take 200 mg by mouth 2 (two) times daily.     [provider]  mycophenolate (MYFORTIC) 180 MG EC tablet Take 1 tablet (180 mg total) by mouth 2 (two) times daily. 10/13/13   Charlynne Coombes, PA-C  predniSONE (DELTASONE) 10 MG tablet Take 10 mg by mouth 2 (two) times daily. Take 20 mg by mouth twice daily for 2 days, then take 10 mg by mouth twice  daily for 2 days. 10/11/13   [provider]  ranitidine (ZANTAC) 150 MG tablet Take 150 mg by mouth daily.    [provider]  telmisartan (MICARDIS) 80 MG tablet Take 80 mg by mouth daily.    [provider]      Allergies    Codeine, Vancomycin, and Amoxicillin    Review of Systems   Review of Systems  Physical Exam Updated Vital Signs BP 120/73   Pulse 73   Resp 16   LMP  (LMP Unknown)   SpO2 100%  Physical Exam Vitals reviewed.  Constitutional:      General: She is not in acute distress.    Appearance: Normal appearance. She is not ill-appearing.  HENT:     Head: Normocephalic.     Right Ear: External ear normal.     Left Ear: External ear normal.     Nose: Nose normal.     Mouth/Throat:     Pharynx: Oropharynx is clear.  Eyes:     Extraocular Movements: Extraocular movements intact.     Pupils: Pupils are equal, round, and reactive to light.  Cardiovascular:     Rate and Rhythm: Normal rate and regular rhythm.     Pulses: Normal pulses.  Pulmonary:     Effort: Pulmonary effort is normal. No respiratory distress.     Breath sounds: Normal breath sounds. No rhonchi.  Abdominal:  General: Abdomen is flat. Bowel sounds are normal. There is no distension.     Palpations: Abdomen is soft. There is no mass.     Tenderness: There is no abdominal tenderness.  Musculoskeletal:        General: Normal range of motion.     Cervical back: Normal range of motion.  Skin:    General: Skin is warm and dry.     Capillary Refill: Capillary refill takes less than 2 seconds.  Neurological:     General: No focal deficit present.     Mental Status: She is alert.  Psychiatric:        Mood and Affect: Mood normal.     ED Results / Procedures / Treatments   Labs (all labs ordered are listed, but only abnormal results are displayed) Labs Reviewed  CBC WITH DIFFERENTIAL/PLATELET - Abnormal; Notable for the following components:      Result Value    RBC 5.30 (*)    MCV 76.2 (*)    All other components within normal limits  COMPREHENSIVE METABOLIC PANEL WITH GFR - Abnormal; Notable for the following components:   Sodium 133 (*)    CO2 14 (*)    BUN 74 (*)    Creatinine, Ser 5.53 (*)    GFR, Estimated 9 (*)    All other components within normal limits  GASTROINTESTINAL PANEL BY PCR, STOOL (REPLACES STOOL CULTURE)  C DIFFICILE QUICK SCREEN W PCR REFLEX    URINALYSIS, ROUTINE W REFLEX MICROSCOPIC  PREGNANCY, URINE    EKG None  Radiology No results found.  Procedures Procedures    Medications Ordered in ED Medications  lactated ringers bolus 1,000 mL (has no administration in time range)  lactated ringers bolus 1,000 mL (0 mLs Intravenous Stopped 05/22/23 0910)  ondansetron (ZOFRAN) injection 4 mg (4 mg Intravenous Given 05/22/23 0818)    ED Course/ Medical Decision Making/ A&P Clinical Course as of 05/22/23 1053  Sat May 22, 2023  1008 CBC is reviewed and interpreted and increased from first prior 7 years ago at 2.  Will query patient and review record to see if she has had more recent baseline [DR]  1047 Care discussed with Dr. Kathrene Bongo [DR]    Clinical Course User Index [DR] Margarita Grizzle, MD                                 Medical Decision Making Amount and/or Complexity of Data Reviewed Labs: ordered.  Risk Prescription drug management.   38 year old female history of SLE, nephrotic syndrome, presents today with nausea, vomiting, and diarrhea that began on Tuesday.  She is currently having ongoing large amounts of diarrhea. Evaluated here and worked up for nausea vomiting and diarrhea.  Abdomen is nontender and did not appear to have any indication for imaging CBC reviewed interpreted within normal limits Complete metabolic panel is significant for sodium of 133, CO2 14, BUN 74, creatinine of 5.53 Patient does not had recent creatinine in system.  Patient does have copies of her labs from Dr.  Hadley Pen office on 123 and at that time her creatinine was 1.58 Stool with C. difficile and GI panel has been ordered. Patient has not been able to void at this time She has received 1 L of LR and is receiving second liter. Page has been placed to both nephrology and hospitalist for admission Patient will need to be admitted for further evaluation of  her nausea vomiting diarrhea and etiology Patient needs to be admitted for dilation of her worsening renal status creatinine at 5.5.  She is being hydrated here in the ED. We have a bladder scan pending at this time Patient looks well with normal blood pressure and heart rate.  She is awake and interactive. Care discussed with Dr. Ansel Kingdom, on-call for nephrology Care discussed with Dr. Lowell Rude on-call for hospitalist.  Dr. Lowell Rude assessed the patient will place admission orders        Final Clinical Impression(s) / ED Diagnoses Final diagnoses:  Nausea vomiting and diarrhea  Acute kidney injury (HCC)  Elevated creatine kinase  SLE (systemic lupus erythematosus related syndrome) (HCC)  Immunosuppression due to chronic steroid use [facility])    Rx / DC Orders ED Discharge Orders     None         Auston Blush, MD 05/22/23 1053

## 2023-05-22 NOTE — ED Notes (Signed)
 Called [facility] for transport, pt bed assignment is ready

## 2023-05-22 NOTE — H&P (Signed)
 History and Physical  Ariel Johns UJW:[id number] DOB: February 18, 1985 DOA: 05/22/2023  PCP: Patient, No Pcp Per   Chief Complaint: Vomiting, diarrhea  HPI: Ariel Johns is a 38 y.o. female with medical history significant for SLE, lupus nephritis, Raynaud's disease, GERD, hypertension being admitted to the hospital with 4 days of intractable vomiting and diarrhea, found to have acute kidney injury.  She was in usual state of health until about 5 nights ago, when she had sudden onset of diarrhea, the following day she also developed some nausea.  Denies any fevers or chills.  No sick contacts.  She has continued to take her medications including prednisone, Lasix, Micardis.  She also takes Myfortic and prednisone.  Workup in the [facility] emergency department showed evidence of acute kidney injury with creatinine 5.5, it seems that her most recent creatinine in the outpatient office was in January 2025 was 1.6.  She was given 2 L of IV fluids, admitted to the hospitalist service.  Currently she is resting comfortably, had a couple of loose bowel movements here at [facility] but no further vomiting.  Review of Systems: Please see HPI for pertinent positives and negatives. A complete 10 system review of systems are otherwise negative.  Past Medical History:  Diagnosis Date   GERD (gastroesophageal reflux disease)    Hypertension    Lupus nephritis (HCC) 2009   diagnosed at age 74   Raynaud disease    Past Surgical History:  Procedure Laterality Date   lymphnode biopsy     RENAL BIOPSY     TONSILLECTOMY AND ADENOIDECTOMY     age 5   WISDOM TOOTH EXTRACTION     Social History:  reports that she quit smoking about 9 years ago. She started smoking about 15 years ago. She does not have any smokeless tobacco history on file. She reports current alcohol use of about 1.0 standard drink of alcohol per week. She reports that she does not use drugs.  Allergies  Allergen Reactions    Codeine Hives   Vancomycin Other (See Comments)    Adversely affects renal functioning.   Amoxicillin Rash and Other (See Comments)    Sick on stomach    History reviewed. No pertinent family history.   Prior to Admission medications   Medication Sig Start Date End Date Taking? Authorizing Provider  furosemide (LASIX) 40 MG tablet Take 80 mg by mouth 2 (two) times daily.   Yes [provider]  hydroxychloroquine (PLAQUENIL) 200 MG tablet Take 200 mg by mouth 2 (two) times daily.    Yes [provider]  mycophenolate (MYFORTIC) 180 MG EC tablet Take 1 tablet (180 mg total) by mouth 2 (two) times daily. 10/13/13  Yes Zeyfang, Myrtie Atkinson, PA-C  predniSONE (DELTASONE) 10 MG tablet Take 5 mg by mouth daily. 10/11/13  Yes [provider]  ranitidine (ZANTAC) 150 MG tablet Take 150 mg by mouth daily.   Yes [provider]  telmisartan (MICARDIS) 80 MG tablet Take 80 mg by mouth daily.   Yes [provider]    Physical Exam: BP 121/80   Pulse 63   Temp 98.1 F (36.7 C) (Oral)   Resp 16   Ht 5\' 6"  (1.676 m)   Wt 107.3 kg   LMP  (LMP Unknown)   SpO2 100%   BMI 38.18 kg/m  General:  Alert, oriented, calm, in no acute distress, husband is at the bedside Eyes: EOMI, clear conjuctivae, white sclerea Neck: supple, no masses, trachea  mildline  Cardiovascular: RRR, no murmurs or rubs, no peripheral edema  Respiratory: clear to auscultation bilaterally, no wheezes, no crackles  Abdomen: soft, nontender, nondistended, normal bowel tones heard  Skin: dry, no rashes  Musculoskeletal: no joint effusions, normal range of motion  Psychiatric: appropriate affect, normal speech  Neurologic: extraocular muscles intact, clear speech, moving all extremities with intact sensorium         Labs on Admission:  Basic Metabolic Panel: Recent Labs  Lab 05/22/23 0802 05/22/23 1339  NA 133* 133*  K 3.7 3.5  CL 105 103  CO2 14* 14*  GLUCOSE 97 95  BUN 74* 71*   CREATININE 5.53* 5.00*  CALCIUM 9.3 8.4*   Liver Function Tests: Recent Labs  Lab 05/22/23 0802  AST 15  ALT 12  ALKPHOS 55  BILITOT 0.3  PROT 6.8  ALBUMIN 3.6   No results for input(s): "LIPASE", "AMYLASE" in the last 168 hours. No results for input(s): "AMMONIA" in the last 168 hours. CBC: Recent Labs  Lab 05/22/23 0802  WBC 7.1  NEUTROABS 5.7  HGB 14.1  HCT 40.4  MCV 76.2*  PLT 253   Cardiac Enzymes: No results for input(s): "CKTOTAL", "CKMB", "CKMBINDEX", "TROPONINI" in the last 168 hours. BNP (last 3 results) No results for input(s): "BNP" in the last 8760 hours.  ProBNP (last 3 results) No results for input(s): "PROBNP" in the last 8760 hours.  CBG: Recent Labs  Lab 05/22/23 1252 05/22/23 1324 05/22/23 1339  GLUCAP 58* 64* 96    Radiological Exams on Admission: No results found. Assessment/Plan Ariel Johns is a 38 y.o. female with medical history significant for SLE, lupus nephritis, Raynaud's disease, GERD, hypertension being admitted to the hospital with 4 days of intractable vomiting and diarrhea, found to have acute kidney injury.   Acute on CKD-likely due to severe volume loss from intractable nausea and vomiting over the last few days.  No documented hypotension, however she was feeling dizzy and lightheaded at home when ambulating so this is suspected as well.  She did finally urinate this afternoon, and creatinine seems to already be improving gradually. -Observation admission -Avoid nephrotoxins -Hydrate aggressively -Follow creatinine with morning labs -Nephrology has been consulted  Metabolic acidosis-due to dehydration and GI losses -Bicarb containing fluids  GERD-famotidine p.o.  Hypertension-hold Micardis  Rheumatoid arthritis-continue prednisone 5 mg p.o. daily, Plaquenil and Myfortic.  Requested pharmacy review for renal dosing.  Will monitor closely for signs of adrenal insufficiency, and administer stress dose steroids  if indicated.  Currently patient is stable with no indication for the above.  DVT prophylaxis: Heparin subcu  Consults called: Nephrology  Admission status: Observation  Time spent: 68 minutes  Dodie Parisi Rickey Charm MD [facility] Pager [phone]  If 7PM-7AM, please contact night-coverage [email or web address] Password [facility]  05/22/2023, 5:00 PM

## 2023-05-22 NOTE — Consult Note (Signed)
 [facility] Renal Consultation Note  Requesting MD: Jannette Mend Indication for Consultation: A on CRF  HPI:  Ariel Johns is a 38 y.o. 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  Had been in remission for several years but then had recurrence in 2015- treated with cytoxan again followed by maintenance cellcept.  Follows with Dr. Irene Mannheim at [facility] and also Dr. Ebbie Goldmann at [facility] rheumatology.  In October of 24 her prt/crt ratio was 1100 and crt was 1.35.  Then was seen in Jan of 2025.  Prt/crt ratio 3600 and crt of 1.58.  Attempted to add Benlysta to her regimen but did not tolerate.  Dr. Ebbie Goldmann mentions the possibility of Saphnelo when he saw her in March but labs were not done.  She presented to medical attention today with N/V/D- feeling light headed when standing.  No low BP recorded but did not take standing.  She continued to take her OP meds of lasix, micardis as well as plaquinil , myfortic and prednisone 10 but only til Monday , then stopped.  Labs today  showed crt of 5.5 at 0800-  then checked later was 5.0-  she has been given 2 liters of fluid and is on IVF at 125/hour.  Other  than the GI sxms she denies complaint. Says legs are skinny for her-  denies any rashes or joint issues.  When she has had lupus nephritis issues in the past-  usually presented with swelling   Creatinine, Ser  Date/Time Value Ref Range Status  05/22/2023 01:39 PM 5.00 (H) 0.44 - 1.00 mg/dL Final  82/95/6213 08:65 AM 5.53 (H) 0.44 - 1.00 mg/dL Final  78/46/9629 52:84 PM 2.13 (H) 0.44 - 1.00 mg/dL Final  13/24/4010 27:25 PM 1.79 (H) 0.44 - 1.00 mg/dL Final  36/64/4034 74:25 AM 0.80 0.50 - 1.10 mg/dL Final  95/63/8756 43:32 AM 0.90 0.50 - 1.10 mg/dL Final  95/18/8416 60:63 AM 0.82 0.50 - 1.10 mg/dL Final   01/60/1093 23:55 AM 0.86 0.50 - 1.10 mg/dL Final  73/22/0254 27:06 AM 0.76 0.50 - 1.10 mg/dL Final  23/76/2831 51:76 AM 1.01 0.50 - 1.10 mg/dL Final  16/08/3708 62:69 AM 1.50 (H) 0.50 - 1.10 mg/dL Final  48/54/6270 35:00 AM 1.67 (H) 0.50 - 1.10 mg/dL Final  93/81/8299 37:16 AM 0.59  Final  02/19/2007 04:15 AM 0.81  Final  02/16/2007 09:12 AM 0.67  Final  02/04/2007 09:23 AM 0.63 0.40 - 1.20 mg/dL Final  96/78/9381 01:75 AM 0.42 DELTA CHECK NOTED  Final  01/22/2007 06:00 AM 0.64  Final  01/20/2007 07:35 PM 0.50  Final  01/20/2007 01:06 PM 0.56 0.40 - 1.20 mg/dL Final  12/03/8525 78:24 AM 0.62  Final     PMHx:   Past Medical History:  Diagnosis Date   GERD (gastroesophageal reflux disease)    Hypertension    Lupus nephritis (HCC) 2009   diagnosed at age 20   Raynaud disease     Past Surgical History:  Procedure Laterality Date   lymphnode biopsy     RENAL BIOPSY     TONSILLECTOMY AND ADENOIDECTOMY     age 79   WISDOM TOOTH EXTRACTION      Family Hx: History reviewed. No pertinent family history.  Social History:  reports that she quit smoking about 9 years ago. She started smoking about 15 years ago.  She does not have any smokeless tobacco history on file. She reports current alcohol use of about 1.0 standard drink of alcohol per week. She reports that she does not use drugs.  Allergies:  Allergies  Allergen Reactions   Codeine Hives   Vancomycin Other (See Comments)    Adversely affects renal functioning.   Amoxicillin Rash and Other (See Comments)    Sick on stomach    Medications: Prior to Admission medications   Medication Sig Start Date End Date Taking? Authorizing Provider  furosemide (LASIX) 40 MG tablet Take 80 mg by mouth 2 (two) times daily.   Yes [provider]  hydroxychloroquine (PLAQUENIL) 200 MG tablet Take 200 mg by mouth 2 (two) times daily.    Yes [provider]  mycophenolate (MYFORTIC) 180 MG EC tablet Take 1 tablet (180  mg total) by mouth 2 (two) times daily. 10/13/13  Yes Zeyfang, Myrtie Atkinson, PA-C  predniSONE (DELTASONE) 10 MG tablet Take 5 mg by mouth daily. 10/11/13  Yes [provider]  ranitidine (ZANTAC) 150 MG tablet Take 150 mg by mouth daily.   Yes [provider]  telmisartan (MICARDIS) 80 MG tablet Take 80 mg by mouth daily.   Yes [provider]    I have reviewed the patient's current medications.  Labs:  Results for orders placed or performed during the hospital encounter of 05/22/23 (from the past 48 hours)  CBC with Differential     Status: Abnormal   Collection Time: 05/22/23  8:02 AM  Result Value Ref Range   WBC 7.1 4.0 - 10.5 K/uL   RBC 5.30 (H) 3.87 - 5.11 MIL/uL   Hemoglobin 14.1 12.0 - 15.0 g/dL   HCT 16.1 09.6 - 04.5 %   MCV 76.2 (L) 80.0 - 100.0 fL   MCH 26.6 26.0 - 34.0 pg   MCHC 34.9 30.0 - 36.0 g/dL   RDW 40.9 81.1 - 91.4 %   Platelets 253 150 - 400 K/uL   nRBC 0.0 0.0 - 0.2 %   Neutrophils Relative % 80 %   Neutro Abs 5.7 1.7 - 7.7 K/uL   Lymphocytes Relative 10 %   Lymphs Abs 0.7 0.7 - 4.0 K/uL   Monocytes Relative 9 %   Monocytes Absolute 0.6 0.1 - 1.0 K/uL   Eosinophils Relative 1 %   Eosinophils Absolute 0.1 0.0 - 0.5 K/uL   Basophils Relative 0 %   Basophils Absolute 0.0 0.0 - 0.1 K/uL   Immature Granulocytes 0 %   Abs Immature Granulocytes 0.03 0.00 - 0.07 K/uL    Comment: Performed at [facility], [address] [place], [place] [address]  Comprehensive metabolic panel     Status: Abnormal   Collection Time: 05/22/23  8:02 AM  Result Value Ref Range   Sodium 133 (L) 135 - 145 mmol/L   Potassium 3.7 3.5 - 5.1 mmol/L   Chloride 105 98 - 111 mmol/L   CO2 14 (L) 22 - 32 mmol/L   Glucose, Bld 97 70 - 99 mg/dL    Comment: Glucose reference range applies only to samples taken after fasting for at least 8 hours.   BUN 74 (H) 6 - 20 mg/dL   Creatinine, Ser 6.21 (H) 0.44 - 1.00 mg/dL   Calcium 9.3 8.9 - 30.8 mg/dL    Total Protein 6.8 6.5 - 8.1 g/dL   Albumin 3.6 3.5 - 5.0 g/dL   AST 15 15 - 41 U/L   ALT 12 0 - 44  U/L   Alkaline Phosphatase 55 38 - 126 U/L   Total Bilirubin 0.3 0.0 - 1.2 mg/dL   GFR, Estimated 9 (L) >60 mL/min    Comment: (NOTE) Calculated using the CKD-EPI Creatinine Equation (2021)    Anion gap 14 5 - 15    Comment: Performed at [facility], [address], [place], [place] [address]  Glucose, capillary     Status: Abnormal   Collection Time: 05/22/23 12:52 PM  Result Value Ref Range   Glucose-Capillary 58 (L) 70 - 99 mg/dL    Comment: Glucose reference range applies only to samples taken after fasting for at least 8 hours.  Glucose, capillary     Status: Abnormal   Collection Time: 05/22/23  1:24 PM  Result Value Ref Range   Glucose-Capillary 64 (L) 70 - 99 mg/dL    Comment: Glucose reference range applies only to samples taken after fasting for at least 8 hours.  Basic metabolic panel     Status: Abnormal   Collection Time: 05/22/23  1:39 PM  Result Value Ref Range   Sodium 133 (L) 135 - 145 mmol/L   Potassium 3.5 3.5 - 5.1 mmol/L   Chloride 103 98 - 111 mmol/L   CO2 14 (L) 22 - 32 mmol/L   Glucose, Bld 95 70 - 99 mg/dL    Comment: Glucose reference range applies only to samples taken after fasting for at least 8 hours.   BUN 71 (H) 6 - 20 mg/dL   Creatinine, Ser 1.91 (H) 0.44 - 1.00 mg/dL   Calcium 8.4 (L) 8.9 - 10.3 mg/dL   GFR, Estimated 11 (L) >60 mL/min    Comment: (NOTE) Calculated using the CKD-EPI Creatinine Equation (2021)    Anion gap 16 (H) 5 - 15    Comment: Performed at [facility], [address], [place], [place] [address]  Glucose, capillary     Status: None   Collection Time: 05/22/23  1:39 PM  Result Value Ref Range   Glucose-Capillary 96 70 - 99 mg/dL    Comment: Glucose reference range applies only to samples taken after fasting for at least 8 hours.     ROS:  A comprehensive review of systems was  negative except for: Gastrointestinal: positive for diarrhea, nausea, and vomiting  Physical Exam: Vitals:   05/22/23 1257 05/22/23 1311  BP: 121/80 121/80  Pulse: 63 63  Resp:  16  Temp: 98.1 F (36.7 C) 98.1 F (36.7 C)  SpO2:       General:  fairly well appearing WF-  c/o weakness but maybe a little better after fluids HEENT: PERRLA, EOMI, mucous membranes moist Neck: no JVD Heart: RRR Lungs: mostly clear Abdomen: obese, soft, non tender Extremities: really minimal edema  Skin: warm and dry Neuro: alert, non focal   Assessment/Plan: 38 year old female with history of lupus and lupus nephritis- on IS meds-  presenting with A on CRF in the setting of GI illness 1.Renal- patient does have some CKD given her history of lupus nephritis-  crt in Jan of 24 was 1.58.  She now presents with A on CRF.  The possibilities for etiology include ATN from volume depletion during  GI illness with ARB in her system  vs a lupus nephritis flare.  Right now I am leaning toward the former and not the later.  Have ordered a UA to help us  decide but patient has had trouble voiding.  Her serum albumin is 3.6 which  argues against a lupus flare-  will check ANA and complements for further evidence.  Right now I would continue with IVF-  holding her home lasix and micardis.  Given that she stopped her long term prednisone abruptly on Monday I am going to give her some low dose steroids IV to mitigate any effects from adrenal insufficiency 2. Hypertension/volume  - is not volume overloaded appearing-  agree with IVF 3. GI illness-  per primary team -  diagnostic tests are pending-  supportive care.  Is immune suppressed so could certainly be infection.  Will hold myfortic for now 4. Anemia  - CBC is normal which makes me feel better as well regarding lupus   Reche Canales 05/22/2023, 4:43 PM

## 2023-05-23 DIAGNOSIS — N1831 Chronic kidney disease, stage 3a: Secondary | ICD-10-CM | POA: Diagnosis present

## 2023-05-23 DIAGNOSIS — E876 Hypokalemia: Secondary | ICD-10-CM | POA: Diagnosis present

## 2023-05-23 DIAGNOSIS — Z881 Allergy status to other antibiotic agents status: Secondary | ICD-10-CM | POA: Diagnosis not present

## 2023-05-23 DIAGNOSIS — Z7952 Long term (current) use of systemic steroids: Secondary | ICD-10-CM | POA: Diagnosis not present

## 2023-05-23 DIAGNOSIS — I7300 Raynaud's syndrome without gangrene: Secondary | ICD-10-CM | POA: Diagnosis present

## 2023-05-23 DIAGNOSIS — N179 Acute kidney failure, unspecified: Secondary | ICD-10-CM | POA: Diagnosis present

## 2023-05-23 DIAGNOSIS — Z87891 Personal history of nicotine dependence: Secondary | ICD-10-CM | POA: Diagnosis not present

## 2023-05-23 DIAGNOSIS — M069 Rheumatoid arthritis, unspecified: Secondary | ICD-10-CM | POA: Diagnosis present

## 2023-05-23 DIAGNOSIS — I129 Hypertensive chronic kidney disease with stage 1 through stage 4 chronic kidney disease, or unspecified chronic kidney disease: Secondary | ICD-10-CM | POA: Diagnosis present

## 2023-05-23 DIAGNOSIS — E861 Hypovolemia: Secondary | ICD-10-CM | POA: Diagnosis present

## 2023-05-23 DIAGNOSIS — A020 Salmonella enteritis: Secondary | ICD-10-CM | POA: Diagnosis present

## 2023-05-23 DIAGNOSIS — Z6838 Body mass index (BMI) 38.0-38.9, adult: Secondary | ICD-10-CM | POA: Diagnosis not present

## 2023-05-23 DIAGNOSIS — E871 Hypo-osmolality and hyponatremia: Secondary | ICD-10-CM | POA: Diagnosis present

## 2023-05-23 DIAGNOSIS — E860 Dehydration: Secondary | ICD-10-CM | POA: Diagnosis present

## 2023-05-23 DIAGNOSIS — D649 Anemia, unspecified: Secondary | ICD-10-CM | POA: Diagnosis not present

## 2023-05-23 DIAGNOSIS — M3214 Glomerular disease in systemic lupus erythematosus: Secondary | ICD-10-CM | POA: Diagnosis present

## 2023-05-23 DIAGNOSIS — Z79899 Other long term (current) drug therapy: Secondary | ICD-10-CM | POA: Diagnosis not present

## 2023-05-23 DIAGNOSIS — E872 Acidosis, unspecified: Secondary | ICD-10-CM | POA: Diagnosis present

## 2023-05-23 DIAGNOSIS — E66812 Obesity, class 2: Secondary | ICD-10-CM | POA: Diagnosis present

## 2023-05-23 DIAGNOSIS — M329 Systemic lupus erythematosus, unspecified: Secondary | ICD-10-CM | POA: Diagnosis present

## 2023-05-23 DIAGNOSIS — Z880 Allergy status to penicillin: Secondary | ICD-10-CM | POA: Diagnosis not present

## 2023-05-23 DIAGNOSIS — Z885 Allergy status to narcotic agent status: Secondary | ICD-10-CM | POA: Diagnosis not present

## 2023-05-23 DIAGNOSIS — K219 Gastro-esophageal reflux disease without esophagitis: Secondary | ICD-10-CM | POA: Diagnosis present

## 2023-05-23 DIAGNOSIS — Z87441 Personal history of nephrotic syndrome: Secondary | ICD-10-CM | POA: Diagnosis not present

## 2023-05-23 LAB — GASTROINTESTINAL PANEL BY PCR, STOOL (REPLACES STOOL CULTURE)

## 2023-05-23 LAB — GLUCOSE, CAPILLARY
Glucose-Capillary: 96 mg/dL (ref 70–99)
Glucose-Capillary: 137 mg/dL — ABNORMAL HIGH (ref 70–99)
Glucose-Capillary: 164 mg/dL — ABNORMAL HIGH (ref 70–99)

## 2023-05-23 LAB — BASIC METABOLIC PANEL WITH GFR
Anion gap: 12 (ref 5–15)
Anion gap: 8 (ref 5–15)
BUN: 68 mg/dL — ABNORMAL HIGH (ref 6–20)
BUN: 73 mg/dL — ABNORMAL HIGH (ref 6–20)
CO2: 18 mmol/L — ABNORMAL LOW (ref 22–32)
CO2: 24 mmol/L (ref 22–32)
Calcium: 7.4 mg/dL — ABNORMAL LOW (ref 8.9–10.3)
Calcium: 7.5 mg/dL — ABNORMAL LOW (ref 8.9–10.3)
Chloride: 100 mmol/L (ref 98–111)
Chloride: 102 mmol/L (ref 98–111)
Creatinine, Ser: 4.38 mg/dL — ABNORMAL HIGH (ref 0.44–1.00)
Creatinine, Ser: 4.83 mg/dL — ABNORMAL HIGH (ref 0.44–1.00)
GFR, Estimated: 11 mL/min — ABNORMAL LOW (ref >60)
GFR, Estimated: 12 mL/min — ABNORMAL LOW (ref >60)
Glucose, Bld: 116 mg/dL — ABNORMAL HIGH (ref 70–99)
Glucose, Bld: 138 mg/dL — ABNORMAL HIGH (ref 70–99)
Potassium: 3.3 mmol/L — ABNORMAL LOW (ref 3.5–5.1)
Potassium: 3.4 mmol/L — ABNORMAL LOW (ref 3.5–5.1)
Sodium: 132 mmol/L — ABNORMAL LOW (ref 135–145)
Sodium: 132 mmol/L — ABNORMAL LOW (ref 135–145)

## 2023-05-23 LAB — CBC
HCT: 31 % — ABNORMAL LOW (ref 36.0–46.0)
Hemoglobin: 10.8 g/dL — ABNORMAL LOW (ref 12.0–15.0)
MCH: 26.7 pg (ref 26.0–34.0)
MCHC: 34.8 g/dL (ref 30.0–36.0)
MCV: 76.5 fL — ABNORMAL LOW (ref 80.0–100.0)
Platelets: 204 10*3/uL (ref 150–400)
RBC: 4.05 MIL/uL (ref 3.87–5.11)
RDW: 12.9 % (ref 11.5–15.5)
WBC: 3.5 10*3/uL — ABNORMAL LOW (ref 4.0–10.5)
nRBC: 0 % (ref 0.0–0.2)

## 2023-05-23 MED ORDER — PREDNISONE 5 MG PO TABS
5.0000 mg | ORAL_TABLET | Freq: Every day | ORAL | Status: DC
  Administered 2023-05-24: 5 mg via ORAL
  Filled 2023-05-23: qty 1

## 2023-05-23 MED ORDER — POTASSIUM CHLORIDE CRYS ER 20 MEQ PO TBCR
20.0000 meq | EXTENDED_RELEASE_TABLET | Freq: Once | ORAL | Status: AC
  Administered 2023-05-23: 20 meq via ORAL
  Filled 2023-05-23: qty 1

## 2023-05-23 NOTE — Plan of Care (Signed)

## 2023-05-23 NOTE — Progress Notes (Signed)
 Admit: 05/22/2023 LOS: 0  18F AKI on CKD3 with history of lupus nephritis likely due to hypovolemia while using ARB  Subjective:  UOP not recorded but she says output improving Creatinine downtrending Tolerating p.o., no more nausea or vomiting, just not eating very much Feels improved overall  04/12 0701 - 04/13 0700 In: 480.6 [P.O.:240; I.V.:240.6] Out: -   Filed Weights   05/22/23 1311  Weight: 107.3 kg    Scheduled Meds:  famotidine  10 mg Oral Daily   heparin  5,000 Units Subcutaneous Q8H   methylPREDNISolone (SOLU-MEDROL) injection  40 mg Intravenous QAC breakfast   Continuous Infusions:  sodium bicarbonate 150 mEq in dextrose 5 % 1,150 mL infusion 125 mL/hr at 05/23/23 0030   PRN Meds:.acetaminophen **OR** acetaminophen, albuterol, ondansetron **OR** ondansetron (ZOFRAN) IV  Current Labs: reviewed   Physical Exam:  Blood pressure 131/71, pulse 70, temperature 98 F (36.7 C), temperature source Oral, resp. rate 18, height 5\' 6"  (1.676 m), weight 107.3 kg, SpO2 96%. NAD, sitting by window Regular, normal S1 and S2 Clear bilaterally No edema Soft, nontender Nonfocal  A AKI on CKD3: Baseline lupus nephritis.  Doubt active flare much more likely this is hemodynamic/hypovolemia with potential ATN.  Appears to be improving with volume resuscitation and holding ARB.  Imaging not performed, I do not think is necessary since we are heading in the direction. SLE with history of lupus nephritis on chronic immunosuppression prednisone, hydroxychloroquine, Myfortic.  Nausea/vomiting/diarrhea: Appears to be improving.  Advance diet.  Monitor Anemia: Hemoglobin improved with hydration, back towards usual values.  Monitor.  Platelets normal.  P Change IV Solu-Medrol to home dose prednisone. Continue IV fluids, LR should be fine, until tolerating p.o. well Trend labs for another 24 hours Do not restart ARB Medication Issues; Preferred narcotic agents for pain control are  hydromorphone, fentanyl, and methadone. Morphine should not be used.  Baclofen should be avoided Avoid oral sodium phosphate and magnesium citrate based laxatives / bowel preps    Fawn Hooks MD 05/23/2023, 9:55 AM  Recent Labs  Lab 05/22/23 1339 05/23/23 0003 05/23/23 0425  NA 133* 132* 132*  K 3.5 3.4* 3.3*  CL 103 102 100  CO2 14* 18* 24  GLUCOSE 95 138* 116*  BUN 71* 73* 68*  CREATININE 5.00* 4.83* 4.38*  CALCIUM 8.4* 7.5* 7.4*   Recent Labs  Lab 05/22/23 0802 05/23/23 0425  WBC 7.1 3.5*  NEUTROABS 5.7  --   HGB 14.1 10.8*  HCT 40.4 31.0*  MCV 76.2* 76.5*  PLT 253 204

## 2023-05-23 NOTE — Progress Notes (Signed)
 PROGRESS NOTE  Ariel Johns UJW:[id number] DOB: 12/16/1985 DOA: 05/22/2023 PCP: Patient, No Pcp Per   LOS: 0 days   Brief Narrative / Interim history: 38 year old female with SLE, lupus nephritis, Raynaud's syndrome, HTN comes into the hospital after several days of intractable nausea, vomiting, diarrhea, and was found to have acute kidney injury.  She has been feeling sick to her stomach for about 4 to 5 days prior to admission.  She has continued to take her medication including prednisone, Lasix, telmisartan.  On admission was found to have a creatinine of 5.5 from a baseline of about 1.6, nephrology was consulted and she was admitted to the hospital.  Stool studies revealed Salmonella  Subjective / 24h Interval events: She is feeling better this morning, denies having any more diarrhea, nausea, vomiting.  She wants to eat today.  Mother is at bedside  Assesement and Plan: Principal problem Acute kidney injury on CKD 3A, history of lupus nephritis, underlying SLE -appreciate nephrology consultation.  This is a slightly to be related to lupus but more so in the setting of hypovolemia due to GI illness as well as use of diuretics and ARB at home -Continue IV fluids, creatinine downtrending -C3 and 4 complements are pending  Active problems Salmonella enteritis -patient has a chicken coop which is likely the source.  She is improving now, no further diarrhea, nausea, vomiting, will not need antibiotics.  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  Scheduled Meds:  famotidine  10 mg Oral Daily   heparin  5,000 Units Subcutaneous Q8H   [START ON 05/24/2023] predniSONE  5 mg Oral Q breakfast   Continuous Infusions:  sodium bicarbonate 150 mEq in dextrose 5 % 1,150 mL infusion 125 mL/hr at 05/23/23 0030   PRN  Meds:.acetaminophen **OR** acetaminophen, albuterol, ondansetron **OR** ondansetron (ZOFRAN) IV  Current Outpatient Medications  Medication Instructions   furosemide (LASIX) 80 mg, Oral, 2 times daily   hydroxychloroquine (PLAQUENIL) 200 mg, Oral, 2 times daily   mycophenolate (MYFORTIC) 180 mg, Oral, 2 times daily   predniSONE (DELTASONE) 5 mg, Oral, Daily   ranitidine (ZANTAC) 150 mg, Oral, Daily   telmisartan (MICARDIS) 80 mg, Oral, Daily    Diet Orders (From admission, onward)     Start     Ordered   05/22/23 1303  Diet regular Room service appropriate? Yes; Fluid consistency: Thin  Diet effective now       Question Answer Comment  Room service appropriate? Yes   Fluid consistency: Thin      05/22/23 1302            DVT prophylaxis: heparin injection 5,000 Units Start: 05/22/23 2200   Lab Results  Component Value Date   PLT 204 05/23/2023      Code Status: Full Code  Family Communication: Mother at bedside  Status is: Observation The patient will require care spanning > 2 midnights and should be moved to inpatient because: Requiring IV fluids   Level of care: Med-Surg  Consultants:  Nephrology   Objective: Vitals:   05/22/23 1311 05/22/23 1850 05/22/23 2115 05/23/23 0427  BP: 121/80 134/83 (!) 150/88 131/71  Pulse: 63 94 86 70  Resp: 16 18 18 18   Temp: 98.1 F (36.7 C) 97.9 F (36.6 C) 98.6 F (37 C) 98 F (36.7 C)  TempSrc: Oral Oral Oral Oral  SpO2:  100% 97%  96%  Weight: 107.3 kg     Height: 5\' 6"  (1.676 m)       Intake/Output Summary (Last 24 hours) at 05/23/2023 1058 Last data filed at 05/22/2023 1627 Gross per 24 hour  Intake 480.59 ml  Output --  Net 480.59 ml   Wt Readings from Last 3 Encounters:  05/22/23 107.3 kg  07/15/15 91.6 kg  02/06/14 93.9 kg    Examination:  Constitutional: NAD Eyes: no scleral icterus ENMT: Mucous membranes are moist.  Neck: normal, supple Respiratory: clear to auscultation bilaterally, no  wheezing, no crackles. Normal respiratory effort. No accessory muscle use.  Cardiovascular: Regular rate and rhythm, no murmurs / rubs / gallops. Trace LE edema.  Abdomen: non distended, no tenderness. Bowel sounds positive.  Musculoskeletal: no clubbing / cyanosis.    Data Reviewed: I have independently reviewed following labs and imaging studies   CBC Recent Labs  Lab 05/22/23 0802 05/23/23 0425  WBC 7.1 3.5*  HGB 14.1 10.8*  HCT 40.4 31.0*  PLT 253 204  MCV 76.2* 76.5*  MCH 26.6 26.7  MCHC 34.9 34.8  RDW 13.1 12.9  LYMPHSABS 0.7  --   MONOABS 0.6  --   EOSABS 0.1  --   BASOSABS 0.0  --     Recent Labs  Lab 05/22/23 0802 05/22/23 1339 05/23/23 0003 05/23/23 0425  NA 133* 133* 132* 132*  K 3.7 3.5 3.4* 3.3*  CL 105 103 102 100  CO2 14* 14* 18* 24  GLUCOSE 97 95 138* 116*  BUN 74* 71* 73* 68*  CREATININE 5.53* 5.00* 4.83* 4.38*  CALCIUM 9.3 8.4* 7.5* 7.4*  AST 15  --   --   --   ALT 12  --   --   --   ALKPHOS 55  --   --   --   BILITOT 0.3  --   --   --   ALBUMIN 3.6  --   --   --     ------------------------------------------------------------------------------------------------------------------ No results for input(s): "CHOL", "HDL", "LDLCALC", "TRIG", "CHOLHDL", "LDLDIRECT" in the last 72 hours.  No results found for: "HGBA1C" ------------------------------------------------------------------------------------------------------------------ No results for input(s): "TSH", "T4TOTAL", "T3FREE", "THYROIDAB" in the last 72 hours.  Invalid input(s): "FREET3"  Cardiac Enzymes No results for input(s): "CKMB", "TROPONINI", "MYOGLOBIN" in the last 168 hours.  Invalid input(s): "CK" ------------------------------------------------------------------------------------------------------------------ No results found for: "BNP"  CBG: Recent Labs  Lab 05/22/23 1324 05/22/23 1339 05/22/23 1704 05/22/23 2125 05/23/23 0729  GLUCAP 64* 96 96 107* 96    Recent  Results (from the past 240 hours)  Gastrointestinal Panel by PCR , Stool     Status: Abnormal   Collection Time: 05/22/23  8:02 AM   Specimen: Stool  Result Value Ref Range Status   Campylobacter species NOT DETECTED NOT DETECTED Final   Plesimonas shigelloides NOT DETECTED NOT DETECTED Final   Salmonella species DETECTED (A) NOT DETECTED Final    Comment: CRITICAL RESULT CALLED TO, READ BACK BY AND VERIFIED WITH: Arlean Bellow RN @ 0309 05/23/23 [facility]    Yersinia enterocolitica NOT DETECTED NOT DETECTED Final   Vibrio species NOT DETECTED NOT DETECTED Final   Vibrio cholerae NOT DETECTED NOT DETECTED Final   Enteroaggregative E coli (EAEC) NOT DETECTED NOT DETECTED Final   Enteropathogenic E coli (EPEC) NOT DETECTED NOT DETECTED Final   Enterotoxigenic E coli (ETEC) NOT DETECTED NOT DETECTED Final   Shiga like toxin producing E coli (STEC) NOT DETECTED NOT DETECTED Final   Shigella/Enteroinvasive E  coli (EIEC) NOT DETECTED NOT DETECTED Final   Cryptosporidium NOT DETECTED NOT DETECTED Final   Cyclospora cayetanensis NOT DETECTED NOT DETECTED Final   Entamoeba histolytica NOT DETECTED NOT DETECTED Final   Giardia lamblia NOT DETECTED NOT DETECTED Final   Adenovirus F40/41 NOT DETECTED NOT DETECTED Final   Astrovirus NOT DETECTED NOT DETECTED Final   Norovirus GI/GII NOT DETECTED NOT DETECTED Final   Rotavirus A NOT DETECTED NOT DETECTED Final   Sapovirus (I, II, IV, and V) NOT DETECTED NOT DETECTED Final    Comment: Performed at [facility], [address] [place], [place] [address]  C Difficile Quick Screen w PCR reflex     Status: None   Collection Time: 05/22/23  8:02 AM   Specimen: Stool  Result Value Ref Range Status   C Diff antigen NEGATIVE NEGATIVE Final   C Diff toxin NEGATIVE NEGATIVE Final   C Diff interpretation No C. difficile detected.  Final    Comment: Performed at [facility], [address], [place], [place] [address]     Radiology Studies: No  results found.   Kathlen Para, MD, PhD [facility]  Between 7 am - 7 pm I am available, please contact me via Amion (for emergencies) or Securechat (non urgent messages)  Between 7 pm - 7 am I am not available, please contact night coverage MD/APP via Amion

## 2023-05-24 DIAGNOSIS — N179 Acute kidney failure, unspecified: Secondary | ICD-10-CM | POA: Diagnosis not present

## 2023-05-24 LAB — CBC
HCT: 31.4 % — ABNORMAL LOW (ref 36.0–46.0)
Hemoglobin: 10.6 g/dL — ABNORMAL LOW (ref 12.0–15.0)
MCH: 26.8 pg (ref 26.0–34.0)
MCHC: 33.8 g/dL (ref 30.0–36.0)
MCV: 79.3 fL — ABNORMAL LOW (ref 80.0–100.0)
Platelets: 216 10*3/uL (ref 150–400)
RBC: 3.96 MIL/uL (ref 3.87–5.11)
RDW: 12.7 % (ref 11.5–15.5)
WBC: 5 10*3/uL (ref 4.0–10.5)
nRBC: 0 % (ref 0.0–0.2)

## 2023-05-24 LAB — COMPREHENSIVE METABOLIC PANEL WITH GFR
ALT: 12 U/L (ref 0–44)
AST: 17 U/L (ref 15–41)
Albumin: 2.4 g/dL — ABNORMAL LOW (ref 3.5–5.0)
Alkaline Phosphatase: 37 U/L — ABNORMAL LOW (ref 38–126)
Anion gap: 12 (ref 5–15)
BUN: 62 mg/dL — ABNORMAL HIGH (ref 6–20)
CO2: 31 mmol/L (ref 22–32)
Calcium: 6.8 mg/dL — ABNORMAL LOW (ref 8.9–10.3)
Chloride: 96 mmol/L — ABNORMAL LOW (ref 98–111)
Creatinine, Ser: 3.32 mg/dL — ABNORMAL HIGH (ref 0.44–1.00)
GFR, Estimated: 17 mL/min — ABNORMAL LOW (ref >60)
Glucose, Bld: 117 mg/dL — ABNORMAL HIGH (ref 70–99)
Potassium: 3 mmol/L — ABNORMAL LOW (ref 3.5–5.1)
Sodium: 139 mmol/L (ref 135–145)
Total Bilirubin: 0.3 mg/dL (ref 0.0–1.2)
Total Protein: 4.7 g/dL — ABNORMAL LOW (ref 6.5–8.1)

## 2023-05-24 LAB — PHOSPHORUS: Phosphorus: 2.7 mg/dL (ref 2.5–4.6)

## 2023-05-24 LAB — ANA: Anti Nuclear Antibody (ANA): POSITIVE — AB

## 2023-05-24 LAB — MAGNESIUM: Magnesium: 1.6 mg/dL — ABNORMAL LOW (ref 1.7–2.4)

## 2023-05-24 MED ORDER — POTASSIUM CHLORIDE CRYS ER 20 MEQ PO TBCR
40.0000 meq | EXTENDED_RELEASE_TABLET | Freq: Once | ORAL | Status: AC
  Administered 2023-05-24: 40 meq via ORAL
  Filled 2023-05-24: qty 2

## 2023-05-24 MED ORDER — FUROSEMIDE 40 MG PO TABS: 80.0000 mg | ORAL_TABLET | Freq: Every day | ORAL | Status: DC | PRN

## 2023-05-24 MED ORDER — FUROSEMIDE 80 MG PO TABS: 80.0000 mg | ORAL_TABLET | Freq: Every day | ORAL | Status: DC | PRN

## 2023-05-24 MED ORDER — MAGNESIUM SULFATE IN D5W 1-5 GM/100ML-% IV SOLN
1.0000 g | Freq: Once | INTRAVENOUS | Status: AC
  Administered 2023-05-24: 1 g via INTRAVENOUS
  Filled 2023-05-24: qty 100

## 2023-05-24 NOTE — Discharge Instructions (Signed)
 Follow with nephrology within a week for repeat labs.  They will arrange an appointment  Please get a complete blood count and chemistry panel checked by your Primary MD at your next visit, and again as instructed by your Primary MD. Please get your medications reviewed and adjusted by your Primary MD.  Please request your Primary MD to go over all Hospital Tests and Procedure/Radiological results at the follow up, please get all Hospital records sent to your Prim MD by signing hospital release before you go home.  In some cases, there will be blood work, cultures and biopsy results pending at the time of your discharge. Please request that your primary care M.D. goes through all the records of your hospital data and follows up on these results.  If you had Pneumonia of Lung problems at the Hospital: Please get a 2 view Chest X ray done in 6-8 weeks after hospital discharge or sooner if instructed by your Primary MD.  If you have Congestive Heart Failure: Please call your Cardiologist or Primary MD anytime you have any of the following symptoms:  1) 3 pound weight gain in 24 hours or 5 pounds in 1 week  2) shortness of breath, with or without a dry hacking cough  3) swelling in the hands, feet or stomach  4) if you have to sleep on extra pillows at night in order to breathe  Follow cardiac low salt diet and 1.5 lit/day fluid restriction.  If you have diabetes Accuchecks 4 times/day, Once in AM empty stomach and then before each meal. Log in all results and show them to your primary doctor at your next visit. If any glucose reading is under 80 or above 300 call your primary MD immediately.  If you have Seizure/Convulsions/Epilepsy: Please do not drive, operate heavy machinery, participate in activities at heights or participate in high speed sports until you have seen by Primary MD or a Neurologist and advised to do so again. Per [place]  DMV statutes, patients with seizures are not  allowed to drive until they have been seizure-free for six months.  Use caution when using heavy equipment or power tools. Avoid working on ladders or at heights. Take showers instead of baths. Ensure the water temperature is not too high on the home water heater. Do not go swimming alone. Do not lock yourself in a room alone (i.e. bathroom). When caring for infants or small children, sit down when holding, feeding, or changing them to minimize risk of injury to the child in the event you have a seizure. Maintain good sleep hygiene. Avoid alcohol.   If you had Gastrointestinal Bleeding: Please ask your Primary MD to check a complete blood count within one week of discharge or at your next visit. Your endoscopic/colonoscopic biopsies that are pending at the time of discharge, will also need to followed by your Primary MD.  Get Medicines reviewed and adjusted. Please take all your medications with you for your next visit with your Primary MD  Please request your Primary MD to go over all hospital tests and procedure/radiological results at the follow up, please ask your Primary MD to get all Hospital records sent to his/her office.  If you experience worsening of your admission symptoms, develop shortness of breath, life threatening emergency, suicidal or homicidal thoughts you must seek medical attention immediately by calling 911 or calling your MD immediately  if symptoms less severe.  You must read complete instructions/literature along with all the possible adverse reactions/side  effects for all the Medicines you take and that have been prescribed to you. Take any new Medicines after you have completely understood and accpet all the possible adverse reactions/side effects.   Do not drive or operate heavy machinery when taking Pain medications.   Do not take more than prescribed Pain, Sleep and Anxiety Medications  Special Instructions: If you have smoked or chewed Tobacco  in the last 2 yrs  please stop smoking, stop any regular Alcohol  and or any Recreational drug use.  Wear Seat belts while driving.  Please note You were cared for by a hospitalist during your hospital stay. If you have any questions about your discharge medications or the care you received while you were in the hospital after you are discharged, you can call the unit and asked to speak with the hospitalist on call if the hospitalist that took care of you is not available. Once you are discharged, your primary care physician will handle any further medical issues. Please note that NO REFILLS for any discharge medications will be authorized once you are discharged, as it is imperative that you return to your primary care physician (or establish a relationship with a primary care physician if you do not have one) for your aftercare needs so that they can reassess your need for medications and monitor your lab values.  You can reach the hospitalist office at phone [phone] or fax [phone]   If you do not have a primary care physician, you can call [phone] for a physician referral.  Activity: As tolerated with Full fall precautions use walker/cane & assistance as needed    Diet: regular  Disposition Home

## 2023-05-24 NOTE — Discharge Summary (Signed)
 Physician Discharge Summary  Ariel Johns HYQ:[id number] DOB: 03-Jan-1986 DOA: 05/22/2023  PCP: Patient, No Pcp Per  Admit date: 05/22/2023 Discharge date: 05/24/2023  Admitted From: home Disposition:  home  Recommendations for Outpatient Follow-up:  Follow up with nephrology in 1 week Please obtain BMP in one week Please follow up on the following pending results: C3 and C4 levels  Home Health: none Equipment/Devices: none  Discharge Condition: stable CODE STATUS: Full code Diet Orders (From admission, onward)     Start     Ordered   05/22/23 1303  Diet regular Room service appropriate? Yes; Fluid consistency: Thin  Diet effective now       Question Answer Comment  Room service appropriate? Yes   Fluid consistency: Thin      05/22/23 1302            HPI: Per admitting MD, Ariel Johns is a 38 y.o. female with medical history significant for SLE, lupus nephritis, Raynaud's disease, GERD, hypertension being admitted to the hospital with 4 days of intractable vomiting and diarrhea, found to have acute kidney injury.  She was in usual state of health until about 5 nights ago, when she had sudden onset of diarrhea, the following day she also developed some nausea.  Denies any fevers or chills.  No sick contacts.  She has continued to take her medications including prednisone, Lasix, Micardis.  She also takes Myfortic and prednisone.  Workup in the [facility] department showed evidence of acute kidney injury with creatinine 5.5, it seems that her most recent creatinine in the outpatient office was in January 2025 was 1.6.  She was given 2 L of IV fluids, admitted to the hospitalist service.  Currently she is resting comfortably, had a couple of loose bowel movements here at [facility] but no further vomiting.   Hospital Course / Discharge diagnoses: Principal problem Acute kidney injury on CKD 3A, history of lupus nephritis, underlying SLE -nephrology  consulted and followed patient was hospitalized.  Her acute kidney injury is likely in the setting of hypovolemia due to GI illness and use of diuretics and ARB at home rather than lupus flareup.  Received supportive care with IV fluids with improvement in her creatinine.  Discussed with nephrology on the day of discharge, will continue to hold ARB at the time of discharge and change her diuretics from daily daily PRN   Active problems Salmonella enteritis -patient has a chicken coop which is likely the source.  She is improving now, no further diarrhea, nausea, vomiting, will not need antibiotics.  Hypokalemia, hypomagnesemia-electrolytes carefully replaced prior to discharge given elevated creatinine.  She no longer has diarrhea and has good p.o. intake now Anemia-in the setting of chronic kidney disease, also dilutional component with fluids.  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   Amoxicillin Rash, Other (See Comments)   Sick on stomach        Medication List     STOP taking these medications    telmisartan 80 MG tablet Commonly known as: MICARDIS       TAKE these medications    furosemide 80 MG tablet Commonly known as: LASIX Take 1 tablet (80 mg total) by mouth daily as needed for edema (for swelling  of legs or other areas take once daily as needed). Start taking on: May 31, 2023 What changed:  medication strength when to take this reasons to take this These instructions start on May 31, 2023. If you are unsure what to do until then, ask your doctor or other care provider.   hydroxychloroquine 200 MG tablet Commonly known as: PLAQUENIL Take 200 mg by mouth 2 (two) times daily.   mycophenolate 180 MG EC tablet Commonly known as: MYFORTIC Take 1  tablet (180 mg total) by mouth 2 (two) times daily.   predniSONE 10 MG tablet Commonly known as: DELTASONE Take 5 mg by mouth daily.   ranitidine 150 MG tablet Commonly known as: ZANTAC Take 150 mg by mouth daily.        Follow-up Information     Charley Constable, MD Follow up in 1 week(s).   Specialty: Nephrology Why: for labs Contact information: [address] [place] [address] [phone]                Consultations: Nephrology   Procedures/Studies:  No results found.   Subjective: - no chest pain, shortness of breath, no abdominal pain, nausea or vomiting.   Discharge Exam: BP 139/83 (BP Location: Left Arm)   Pulse 72   Temp 98 F (36.7 C) (Oral)   Resp 18   Ht 5\' 6"  (1.676 m)   Wt 107.3 kg   LMP  (LMP Unknown)   SpO2 100%   BMI 38.18 kg/m   General: Pt is alert, awake, not in acute distress Cardiovascular: RRR, S1/S2 +, no rubs, no gallops Respiratory: CTA bilaterally, no wheezing, no rhonchi Abdominal: Soft, NT, ND, bowel sounds + Extremities: no edema, no cyanosis  The results of significant diagnostics from this hospitalization (including imaging, microbiology, ancillary and laboratory) are listed below for reference.     Microbiology: Recent Results (from the past 240 hours)  Gastrointestinal Panel by PCR , Stool     Status: Abnormal   Collection Time: 05/22/23  8:02 AM   Specimen: Stool  Result Value Ref Range Status   Campylobacter species NOT DETECTED NOT DETECTED Final   Plesimonas shigelloides NOT DETECTED NOT DETECTED Final   Salmonella species DETECTED (A) NOT DETECTED Final    Comment: CRITICAL RESULT CALLED TO, READ BACK BY AND VERIFIED WITH: Arlean Bellow RN @ [phone] 05/23/23 [facility]    Yersinia enterocolitica NOT DETECTED NOT DETECTED Final   Vibrio species NOT DETECTED NOT DETECTED Final   Vibrio cholerae NOT DETECTED NOT DETECTED Final   Enteroaggregative E coli (EAEC) NOT DETECTED NOT DETECTED Final    Enteropathogenic E coli (EPEC) NOT DETECTED NOT DETECTED Final   Enterotoxigenic E coli (ETEC) NOT DETECTED NOT DETECTED Final   Shiga like toxin producing E coli (STEC) NOT DETECTED NOT DETECTED Final   Shigella/Enteroinvasive E coli (EIEC) NOT DETECTED NOT DETECTED Final   Cryptosporidium NOT DETECTED NOT DETECTED Final   Cyclospora cayetanensis NOT DETECTED NOT DETECTED Final   Entamoeba histolytica NOT DETECTED NOT DETECTED Final   Giardia lamblia NOT DETECTED NOT DETECTED Final   Adenovirus F40/41 NOT DETECTED NOT DETECTED Final   Astrovirus NOT DETECTED NOT DETECTED Final   Norovirus GI/GII NOT DETECTED NOT DETECTED Final   Rotavirus A NOT DETECTED NOT DETECTED Final   Sapovirus (I, II, IV, and V) NOT DETECTED NOT DETECTED Final    Comment: Performed at [facility], [address] [place], [place] [address]  C Difficile Quick Screen w PCR reflex  Status: None   Collection Time: 05/22/23  8:02 AM   Specimen: Stool  Result Value Ref Range Status   C Diff antigen NEGATIVE NEGATIVE Final   C Diff toxin NEGATIVE NEGATIVE Final   C Diff interpretation No C. difficile detected.  Final    Comment: Performed at [facility], [address] [place], [place] [address]     Labs: Basic Metabolic Panel: Recent Labs  Lab 05/22/23 0802 05/22/23 1339 05/23/23 0003 05/23/23 0425 05/24/23 0427  NA 133* 133* 132* 132* 139  K 3.7 3.5 3.4* 3.3* 3.0*  CL 105 103 102 100 96*  CO2 14* 14* 18* 24 31  GLUCOSE 97 95 138* 116* 117*  BUN 74* 71* 73* 68* 62*  CREATININE 5.53* 5.00* 4.83* 4.38* 3.32*  CALCIUM 9.3 8.4* 7.5* 7.4* 6.8*  MG  --   --   --   --  1.6*  PHOS  --   --   --   --  2.7   Liver Function Tests: Recent Labs  Lab 05/22/23 0802 05/24/23 0427  AST 15 17  ALT 12 12  ALKPHOS 55 37*  BILITOT 0.3 0.3  PROT 6.8 4.7*  ALBUMIN 3.6 2.4*   CBC: Recent Labs  Lab 05/22/23 0802 05/23/23 0425 05/24/23 0427  WBC 7.1 3.5* 5.0  NEUTROABS 5.7  --   --   HGB  14.1 10.8* 10.6*  HCT 40.4 31.0* 31.4*  MCV 76.2* 76.5* 79.3*  PLT 253 204 216   CBG: Recent Labs  Lab 05/22/23 1704 05/22/23 2125 05/23/23 0729 05/23/23 1236 05/23/23 1631  GLUCAP 96 107* 96 137* 164*   Hgb A1c No results for input(s): "HGBA1C" in the last 72 hours. Lipid Profile No results for input(s): "CHOL", "HDL", "LDLCALC", "TRIG", "CHOLHDL", "LDLDIRECT" in the last 72 hours. Thyroid function studies No results for input(s): "TSH", "T4TOTAL", "T3FREE", "THYROIDAB" in the last 72 hours.  Invalid input(s): "FREET3" Urinalysis    Component Value Date/Time   COLORURINE YELLOW 05/22/2023 0652   APPEARANCEUR HAZY (A) 05/22/2023 0652   LABSPEC 1.011 05/22/2023 0652   LABSPEC 1.030 01/20/2007 1306   PHURINE 5.0 05/22/2023 0652   GLUCOSEU NEGATIVE 05/22/2023 0652   HGBUR NEGATIVE 05/22/2023 0652   BILIRUBINUR NEGATIVE 05/22/2023 0652   BILIRUBINUR Negative 01/20/2007 1306   KETONESUR NEGATIVE 05/22/2023 0652   PROTEINUR 30 (A) 05/22/2023 0652   UROBILINOGEN 0.2 08/13/2008 1352   NITRITE NEGATIVE 05/22/2023 0652   LEUKOCYTESUR MODERATE (A) 05/22/2023 0652   LEUKOCYTESUR Trace 01/20/2007 1306    FURTHER DISCHARGE INSTRUCTIONS:   Get Medicines reviewed and adjusted: Please take all your medications with you for your next visit with your Primary MD   Laboratory/radiological data: Please request your Primary MD to go over all hospital tests and procedure/radiological results at the follow up, please ask your Primary MD to get all Hospital records sent to his/her office.   In some cases, they will be blood work, cultures and biopsy results pending at the time of your discharge. Please request that your primary care M.D. goes through all the records of your hospital data and follows up on these results.   Also Note the following: If you experience worsening of your admission symptoms, develop shortness of breath, life threatening emergency, suicidal or homicidal thoughts  you must seek medical attention immediately by calling 911 or calling your MD immediately  if symptoms less severe.   You must read complete instructions/literature along with all the possible adverse reactions/side effects for  all the Medicines you take and that have been prescribed to you. Take any new Medicines after you have completely understood and accpet all the possible adverse reactions/side effects.    Do not drive when taking Pain medications or sleeping medications (Benzodaizepines)   Do not take more than prescribed Pain, Sleep and Anxiety Medications. It is not advisable to combine anxiety,sleep and pain medications without talking with your primary care practitioner   Special Instructions: If you have smoked or chewed Tobacco  in the last 2 yrs please stop smoking, stop any regular Alcohol  and or any Recreational drug use.   Wear Seat belts while driving.   Please note: You were cared for by a hospitalist during your hospital stay. Once you are discharged, your primary care physician will handle any further medical issues. Please note that NO REFILLS for any discharge medications will be authorized once you are discharged, as it is imperative that you return to your primary care physician (or establish a relationship with a primary care physician if you do not have one) for your post hospital discharge needs so that they can reassess your need for medications and monitor your lab values.  Time coordinating discharge: 35 minutes  SIGNED:  Kathlen Para, MD, PhD 05/24/2023, 11:10 AM

## 2023-05-24 NOTE — Progress Notes (Signed)
 Patient ready for dc, have d/w pmd. Plan is for: 1) hold ARB for now 2) hold po lasix 80 bid x 1 wk; resume at 80mg  daily prn on 4/21 3) our office will contact pt about next appt in 1-2 wks at [facility]  Larry Poag  MD  [facility] 05/24/2023, 12:07 PM

## 2023-05-24 NOTE — TOC CM/SW Note (Signed)
 Transition of Care [facility]) - Inpatient Brief Assessment   Patient Details  Name: Ariel Johns MRN: [id number] Date of Birth: 1986-01-10  Transition of Care [facility]) CM/SW Contact:    Kathryn Parish, RN Phone Number: 05/24/2023, 9:22 AM   Clinical Narrative: No TOC needs identified on review of chart.    Transition of Care Asessment: Insurance and Status: Insurance coverage has been reviewed     Prior level of function:: Independent Prior/Current Home Services: No current home services Social Drivers of Health Review: SDOH reviewed no interventions necessary Readmission risk has been reviewed: No Transition of care needs: no transition of care needs at this time

## 2023-05-25 LAB — C3 COMPLEMENT: C3 Complement: 86 mg/dL (ref 82–167)

## 2023-05-25 LAB — C4 COMPLEMENT: Complement C4, Body Fluid: 21 mg/dL (ref 12–38)

## 2023-06-04 ENCOUNTER — Other Ambulatory Visit (HOSPITAL_COMMUNITY): Payer: Self-pay | Admitting: Nephrology

## 2023-06-04 DIAGNOSIS — M3214 Glomerular disease in systemic lupus erythematosus: Secondary | ICD-10-CM

## 2023-06-15 LAB — MISCELLANEOUS TEST

## 2023-06-29 ENCOUNTER — Other Ambulatory Visit: Payer: Self-pay | Admitting: Radiology

## 2023-06-29 DIAGNOSIS — R748 Abnormal levels of other serum enzymes: Secondary | ICD-10-CM

## 2023-06-29 NOTE — Progress Notes (Addendum)
 Chief Complaint: CKD - concern for Lupus Nephritis  Referring Provider(s): [place]  Supervising Physician: Creasie Doctor  Patient Status: [facility] - Out-pt  History of Present Illness: Ariel Johns is a 38 y.o. female with medical history significant for SLE, lupus nephritis, Raynaud's disease, GERD, hypertension.  She was recently admitted to the hospital with 4 days of intractable vomiting and diarrhea. She was found to have acute kidney injury with creatinine of 5.5,.  Creatinine in January 2025 was 1.6.    The kidney injury was likely in the setting of hypovolemia due to GI illness and use of diuretics and ARB  rather than lupus flare.  She is here today for random renal biopsy.  No nausea/vomiting. No Fever/chills. ROS negative.  Patient is Full Code  Past Medical History:  Diagnosis Date   GERD (gastroesophageal reflux disease)    Hypertension    Lupus nephritis (HCC) 2009   diagnosed at age 42   Raynaud disease     Past Surgical History:  Procedure Laterality Date   lymphnode biopsy     RENAL BIOPSY     TONSILLECTOMY AND ADENOIDECTOMY     age 48   WISDOM TOOTH EXTRACTION      Allergies: Codeine, Vancomycin , and Amoxicillin  Medications: Prior to Admission medications   Medication Sig Start Date End Date Taking? Authorizing Provider  furosemide  (LASIX ) 80 MG tablet Take 1 tablet (80 mg total) by mouth daily as needed for edema (for swelling of legs or other areas take once daily as needed). 05/31/23   Gherghe, Costin M, MD  hydroxychloroquine  (PLAQUENIL ) 200 MG tablet Take 200 mg by mouth 2 (two) times daily.     [provider]  mycophenolate  (MYFORTIC ) 180 MG EC tablet Take 1 tablet (180 mg total) by mouth 2 (two) times daily. 10/13/13   Charlynne Coombes, PA-C  predniSONE  (DELTASONE ) 10 MG tablet Take 5 mg by mouth daily. 10/11/13   [provider]  ranitidine (ZANTAC) 150 MG tablet Take 150 mg by mouth daily.    [provider]     History reviewed. No pertinent family history.  Social History   Socioeconomic History   Marital status: Married    Spouse name: Not on file   Number of children: Not on file   Years of education: Not on file   Highest education level: Not on file  Occupational History   Not on file  Tobacco Use   Smoking status: Former    Current packs/day: 0.00    Types: Cigarettes    Start date: 09/08/2007    Quit date: 09/07/2013    Years since quitting: 9.8   Smokeless tobacco: Not on file  Substance and Sexual Activity   Alcohol use: Yes    Alcohol/week: 1.0 standard drink of alcohol    Types: 1 drink(s) per week   Drug use: No   Sexual activity: Not on file  Other Topics Concern   Not on file  Social History Narrative   Not on file   Social Drivers of Health   Financial Resource Strain: Not on file  Food Insecurity: No Food Insecurity (05/22/2023)   Hunger Vital Sign    Worried About Running Out of Food in the Last Year: Never true    Ran Out of Food in the Last Year: Never true  Transportation Needs: No Transportation Needs (05/22/2023)   PRAPARE - Transportation    Lack of Transportation (Medical): No    Lack of  Transportation (Non-Medical): No  Physical Activity: Not on file  Stress: Not on file  Social Connections: Not on file     Review of Systems: A 12 point ROS discussed and pertinent positives are indicated in the HPI above.  All other systems are negative.    Vital Signs: BP (!) 158/93   Pulse 72   Temp 98.2 F (36.8 C) (Oral)   Resp 18   Ht 5\' 6"  (1.676 m)   Wt 237 lb (107.5 kg)   LMP 06/21/2023 (Exact Date)   SpO2 100%   BMI 38.25 kg/m   Advance Care Plan: The advanced care place/surrogate decision maker was discussed at the time of visit and the patient did not wish to discuss or was not able to name a surrogate decision maker or provide an advance care plan.  Physical Exam Vitals reviewed.  Constitutional:      Appearance:  Normal appearance.  HENT:     Head: Normocephalic and atraumatic.  Eyes:     Extraocular Movements: Extraocular movements intact.  Cardiovascular:     Rate and Rhythm: Normal rate and regular rhythm.  Pulmonary:     Effort: Pulmonary effort is normal. No respiratory distress.     Breath sounds: Normal breath sounds.  Abdominal:     Palpations: Abdomen is soft.  Musculoskeletal:        General: Normal range of motion.     Cervical back: Normal range of motion.  Skin:    General: Skin is warm and dry.  Neurological:     General: No focal deficit present.     Mental Status: She is alert and oriented to person, place, and time.  Psychiatric:        Mood and Affect: Mood normal.        Behavior: Behavior normal.        Thought Content: Thought content normal.        Judgment: Judgment normal.    Imaging: No results found.  Labs:  CBC: Recent Labs    05/22/23 0802 05/23/23 0425 05/24/23 0427 06/30/23 0642  WBC 7.1 3.5* 5.0 4.4  HGB 14.1 10.8* 10.6* 10.6*  HCT 40.4 31.0* 31.4* 31.4*  PLT 253 204 216 206    COAGS: Recent Labs    06/30/23 0642  INR 1.0    BMP: Recent Labs    05/22/23 1339 05/23/23 0003 05/23/23 0425 05/24/23 0427  NA 133* 132* 132* 139  K 3.5 3.4* 3.3* 3.0*  CL 103 102 100 96*  CO2 14* 18* 24 31  GLUCOSE 95 138* 116* 117*  BUN 71* 73* 68* 62*  CALCIUM  8.4* 7.5* 7.4* 6.8*  CREATININE 5.00* 4.83* 4.38* 3.32*  GFRNONAA 11* 11* 12* 17*    LIVER FUNCTION TESTS: Recent Labs    05/22/23 0802 05/24/23 0427  BILITOT 0.3 0.3  AST 15 17  ALT 12 12  ALKPHOS 55 37*  PROT 6.8 4.7*  ALBUMIN 3.6 2.4*    TUMOR MARKERS: No results for input(s): "AFPTM", "CEA", "CA199", "CHROMGRNA" in the last 8760 hours.  Assessment and Plan: Kidney disease likely due to Lupus Nephritis - recent AKI due to GI illness.  Will proceed with image guided random renal biopsy today by Dr. Jinx Mourning.  Risks and benefits of random renal was discussed with the  patient and/or patient's family including, but not limited to bleeding, infection, damage to adjacent structures or low yield requiring additional tests.  All of the questions were answered and there is agreement to  proceed.  Consent signed and in chart.  *Addendum* - Patient's blood pressure remains too elevated to proceed with elective Random renal biopsy. I have sent a staff message to Nephrology to make them aware. Will reschedule.  Electronically Signed: Connor Deiters, PA-C   06/30/2023, 7:58 AM      I spent a total of  30 Minutes   in face to face in clinical consultation, greater than 50% of which was counseling/coordinating care for random renal biopsy.

## 2023-06-30 ENCOUNTER — Encounter (HOSPITAL_COMMUNITY): Payer: Self-pay

## 2023-06-30 ENCOUNTER — Ambulatory Visit (HOSPITAL_COMMUNITY)
Admission: RE | Admit: 2023-06-30 | Discharge: 2023-06-30 | Disposition: A | Discharge status: Home / Self Care | Source: Ambulatory Visit | Attending: Nephrology | Admitting: Nephrology

## 2023-06-30 ENCOUNTER — Other Ambulatory Visit: Payer: Self-pay

## 2023-06-30 DIAGNOSIS — M3214 Glomerular disease in systemic lupus erythematosus: Secondary | ICD-10-CM | POA: Insufficient documentation

## 2023-06-30 DIAGNOSIS — R748 Abnormal levels of other serum enzymes: Secondary | ICD-10-CM | POA: Insufficient documentation

## 2023-06-30 LAB — CBC
HCT: 31.4 % — ABNORMAL LOW (ref 36.0–46.0)
Hemoglobin: 10.6 g/dL — ABNORMAL LOW (ref 12.0–15.0)
MCH: 27.1 pg (ref 26.0–34.0)
MCHC: 33.8 g/dL (ref 30.0–36.0)
MCV: 80.3 fL (ref 80.0–100.0)
Platelets: 206 10*3/uL (ref 150–400)
RBC: 3.91 MIL/uL (ref 3.87–5.11)
RDW: 14.1 % (ref 11.5–15.5)
WBC: 4.4 10*3/uL (ref 4.0–10.5)
nRBC: 0 % (ref 0.0–0.2)

## 2023-06-30 LAB — PROTIME-INR
INR: 1 (ref 0.8–1.2)
Prothrombin Time: 13.1 s (ref 11.4–15.2)

## 2023-06-30 LAB — PREGNANCY, URINE: Preg Test, Ur: NEGATIVE

## 2023-06-30 MED ORDER — SODIUM CHLORIDE 0.9% FLUSH: 3.0000 mL | INTRAVENOUS | Status: DC | PRN

## 2023-06-30 MED ORDER — HYDRALAZINE HCL 20 MG/ML IJ SOLN
INTRAMUSCULAR | Status: AC
  Filled 2023-06-30: qty 1

## 2023-06-30 MED ORDER — HYDRALAZINE HCL 20 MG/ML IJ SOLN
10.0000 mg | Freq: Once | INTRAMUSCULAR | Status: AC
  Administered 2023-06-30: 10 mg via INTRAVENOUS

## 2023-06-30 MED ORDER — SODIUM CHLORIDE 0.9% FLUSH: 3.0000 mL | Freq: Two times a day (BID) | INTRAVENOUS | Status: DC

## 2023-06-30 MED ORDER — HYDRALAZINE HCL 20 MG/ML IJ SOLN
5.0000 mg | Freq: Once | INTRAMUSCULAR | Status: AC
  Administered 2023-06-30: 5 mg via INTRAVENOUS

## 2023-06-30 MED ORDER — CLONIDINE HCL 0.2 MG PO TABS: 0.2000 mg | ORAL_TABLET | Freq: Once | ORAL | Status: DC

## 2023-06-30 MED ORDER — HYDRALAZINE HCL 20 MG/ML IJ SOLN
INTRAMUSCULAR | Status: AC
Start: 2023-06-30 — End: ?
  Filled 2023-06-30: qty 1

## 2023-06-30 NOTE — Sedation Documentation (Signed)
 Per MD Suttle, will trend BP for next 30 min.

## 2023-06-30 NOTE — Sedation Documentation (Signed)
 Pt stated she did not take her PO BP meds this AM. MD Suttle and PA Doraine Gallon aware. See MAR.

## 2023-06-30 NOTE — Sedation Documentation (Signed)
 Per MD Suttle, procedure needs to be rescheduled d/t elevated BP. Pt expressed frustration and stated she will "not be coming back again". Pt and family verbalized understanding and all questions were answered by this RN. Pt discharged from radiology department with family.

## 2023-07-19 ENCOUNTER — Emergency Department (HOSPITAL_COMMUNITY)

## 2023-07-19 ENCOUNTER — Other Ambulatory Visit: Payer: Self-pay

## 2023-07-19 ENCOUNTER — Inpatient Hospital Stay (HOSPITAL_COMMUNITY)
Admission: EM | Admit: 2023-07-19 | Discharge: 2023-07-25 | DRG: 546 | Disposition: A | Discharge status: Home / Self Care | Attending: Internal Medicine | Admitting: Internal Medicine

## 2023-07-19 ENCOUNTER — Encounter (HOSPITAL_COMMUNITY): Payer: Self-pay

## 2023-07-19 DIAGNOSIS — Z885 Allergy status to narcotic agent status: Secondary | ICD-10-CM

## 2023-07-19 DIAGNOSIS — Z86718 Personal history of other venous thrombosis and embolism: Secondary | ICD-10-CM

## 2023-07-19 DIAGNOSIS — N179 Acute kidney failure, unspecified: Principal | ICD-10-CM | POA: Diagnosis present

## 2023-07-19 DIAGNOSIS — R001 Bradycardia, unspecified: Secondary | ICD-10-CM | POA: Diagnosis present

## 2023-07-19 DIAGNOSIS — E66812 Obesity, class 2: Secondary | ICD-10-CM | POA: Diagnosis present

## 2023-07-19 DIAGNOSIS — N189 Chronic kidney disease, unspecified: Principal | ICD-10-CM

## 2023-07-19 DIAGNOSIS — I160 Hypertensive urgency: Secondary | ICD-10-CM

## 2023-07-19 DIAGNOSIS — Z880 Allergy status to penicillin: Secondary | ICD-10-CM

## 2023-07-19 DIAGNOSIS — I161 Hypertensive emergency: Secondary | ICD-10-CM | POA: Diagnosis present

## 2023-07-19 DIAGNOSIS — Z87891 Personal history of nicotine dependence: Secondary | ICD-10-CM

## 2023-07-19 DIAGNOSIS — K219 Gastro-esophageal reflux disease without esophagitis: Secondary | ICD-10-CM | POA: Diagnosis present

## 2023-07-19 DIAGNOSIS — N19 Unspecified kidney failure: Secondary | ICD-10-CM

## 2023-07-19 DIAGNOSIS — Z6839 Body mass index (BMI) 39.0-39.9, adult: Secondary | ICD-10-CM

## 2023-07-19 DIAGNOSIS — D6869 Other thrombophilia: Secondary | ICD-10-CM | POA: Diagnosis present

## 2023-07-19 DIAGNOSIS — N1832 Chronic kidney disease, stage 3b: Secondary | ICD-10-CM | POA: Diagnosis present

## 2023-07-19 DIAGNOSIS — I2489 Other forms of acute ischemic heart disease: Secondary | ICD-10-CM | POA: Diagnosis present

## 2023-07-19 DIAGNOSIS — Z7952 Long term (current) use of systemic steroids: Secondary | ICD-10-CM

## 2023-07-19 DIAGNOSIS — M3214 Glomerular disease in systemic lupus erythematosus: Principal | ICD-10-CM | POA: Diagnosis present

## 2023-07-19 DIAGNOSIS — M329 Systemic lupus erythematosus, unspecified: Secondary | ICD-10-CM

## 2023-07-19 DIAGNOSIS — I7300 Raynaud's syndrome without gangrene: Secondary | ICD-10-CM | POA: Diagnosis present

## 2023-07-19 DIAGNOSIS — Z79624 Long term (current) use of inhibitors of nucleotide synthesis: Secondary | ICD-10-CM

## 2023-07-19 DIAGNOSIS — Z79899 Other long term (current) drug therapy: Secondary | ICD-10-CM

## 2023-07-19 DIAGNOSIS — R809 Proteinuria, unspecified: Secondary | ICD-10-CM

## 2023-07-19 DIAGNOSIS — Z881 Allergy status to other antibiotic agents status: Secondary | ICD-10-CM

## 2023-07-19 DIAGNOSIS — D631 Anemia in chronic kidney disease: Secondary | ICD-10-CM | POA: Diagnosis present

## 2023-07-19 DIAGNOSIS — R0609 Other forms of dyspnea: Secondary | ICD-10-CM

## 2023-07-19 DIAGNOSIS — I129 Hypertensive chronic kidney disease with stage 1 through stage 4 chronic kidney disease, or unspecified chronic kidney disease: Secondary | ICD-10-CM | POA: Diagnosis present

## 2023-07-19 LAB — COMPREHENSIVE METABOLIC PANEL WITH GFR
ALT: 22 U/L (ref 0–44)
AST: 19 U/L (ref 15–41)
Albumin: 3 g/dL — ABNORMAL LOW (ref 3.5–5.0)
Alkaline Phosphatase: 38 U/L (ref 38–126)
Anion gap: 13 (ref 5–15)
BUN: 126 mg/dL — ABNORMAL HIGH (ref 6–20)
CO2: 19 mmol/L — ABNORMAL LOW (ref 22–32)
Calcium: 9.4 mg/dL (ref 8.9–10.3)
Chloride: 104 mmol/L (ref 98–111)
Creatinine, Ser: 3.43 mg/dL — ABNORMAL HIGH (ref 0.44–1.00)
GFR, Estimated: 17 mL/min — ABNORMAL LOW (ref >60)
Glucose, Bld: 121 mg/dL — ABNORMAL HIGH (ref 70–99)
Potassium: 4.6 mmol/L (ref 3.5–5.1)
Sodium: 136 mmol/L (ref 135–145)
Total Bilirubin: 0.7 mg/dL (ref 0.0–1.2)
Total Protein: 5.2 g/dL — ABNORMAL LOW (ref 6.5–8.1)

## 2023-07-19 LAB — CBC
HCT: 38.3 % (ref 36.0–46.0)
Hemoglobin: 12.1 g/dL (ref 12.0–15.0)
MCH: 26.4 pg (ref 26.0–34.0)
MCHC: 31.6 g/dL (ref 30.0–36.0)
MCV: 83.6 fL (ref 80.0–100.0)
Platelets: 281 10*3/uL (ref 150–400)
RBC: 4.58 MIL/uL (ref 3.87–5.11)
RDW: 14.3 % (ref 11.5–15.5)
WBC: 9 10*3/uL (ref 4.0–10.5)
nRBC: 0 % (ref 0.0–0.2)

## 2023-07-19 LAB — URINALYSIS, ROUTINE W REFLEX MICROSCOPIC
Bilirubin Urine: NEGATIVE
Glucose, UA: NEGATIVE mg/dL
Ketones, ur: NEGATIVE mg/dL
Nitrite: NEGATIVE
Protein, ur: >=300 mg/dL — AB
Specific Gravity, Urine: 1.011 (ref 1.005–1.030)
pH: 5 (ref 5.0–8.0)

## 2023-07-19 LAB — PROTEIN / CREATININE RATIO, URINE
Creatinine, Urine: 84 mg/dL
Protein Creatinine Ratio: 4.04 mg/mg{creat} — ABNORMAL HIGH (ref 0.00–0.15)
Total Protein, Urine: 339 mg/dL

## 2023-07-19 LAB — HCG, SERUM, QUALITATIVE: Preg, Serum: NEGATIVE

## 2023-07-19 LAB — TROPONIN I (HIGH SENSITIVITY)
Troponin I (High Sensitivity): 24 ng/L — ABNORMAL HIGH (ref <18)
Troponin I (High Sensitivity): 28 ng/L — ABNORMAL HIGH (ref <18)

## 2023-07-19 LAB — SODIUM, URINE, RANDOM: Sodium, Ur: 36 mmol/L

## 2023-07-19 MED ORDER — HYDRALAZINE HCL 20 MG/ML IJ SOLN
10.0000 mg | Freq: Once | INTRAMUSCULAR | Status: AC
  Administered 2023-07-20: 10 mg via INTRAVENOUS
  Filled 2023-07-19: qty 1

## 2023-07-19 MED ORDER — HYDRALAZINE HCL 25 MG PO TABS
50.0000 mg | ORAL_TABLET | Freq: Once | ORAL | Status: AC
  Administered 2023-07-19: 50 mg via ORAL
  Filled 2023-07-19: qty 2

## 2023-07-19 MED ORDER — LABETALOL HCL 5 MG/ML IV SOLN: 20.0000 mg | Freq: Once | INTRAVENOUS | Status: DC

## 2023-07-19 NOTE — ED Triage Notes (Signed)
 Pt reports HTN, BIL LE swelling, decreased urination, and generalized weakness x1 month. Pt has h/x lupus nephritis

## 2023-07-19 NOTE — ED Provider Notes (Signed)
 [place] [facility] AT [facility] Provider Note   CSN: [id number] Arrival date & time: 07/19/23  1724     History  Chief Complaint  Patient presents with   Hypertension    Ariel Johns is a 38 y.o. female.  HPI     38 year old female comes in with chief complaint of elevated blood pressure, worsening leg swelling, decreased urination and generalized weakness.  Patient has history of lupus nephritis, currently on 60 mg prednisone .  She was recently admitted to the hospital because of hypovolemia leading to renal failure.  While in the hospital, she received IV fluids and her renal function improved.  She subsequently has been closely followed by nephrology service.  She informs me that on May 30, she received a call from the nephrology team for her to go to the hospital.  Her white count had gone up, and her renal function and markers were looking worse.  At that time patient was not able to come to the ER because of other obligations and because she was feeling well.  However in the last few days, patient has been having positional headaches, headaches with exertion and at the end of the day after long day at work, she also has noticed increasing leg swelling, reduced urine output and exertional shortness of breath.  Her BP is also elevated.  Her BP medications have been tweaked after her recent admission to the hospital.  She called her renal doctors again, they advised that she come to the ER.  Patient states that her BP has been over 180 for several days now, in fact her nephrologist had ordered a biopsy of her kidneys which was canceled because her BP was high.  Patient has not consulted her rheumatologist about this current issue.  Home Medications Prior to Admission medications   Medication Sig Start Date End Date Taking? Authorizing Provider  Ascorbic Acid 125 MG CHEW Chew 2 tablets by mouth daily. 04/03/16  Yes [provider]  carvedilol  (COREG) 12.5 MG tablet Take 12.5 mg by mouth 2 (two) times daily with a meal. 03/22/16  Yes [provider]  cloNIDine  (CATAPRES ) 0.1 MG tablet Take 0.2 mg by mouth 2 (two) times daily. 03/22/16  Yes [provider]  DILT-XR 120 MG 24 hr capsule Take 120 mg by mouth daily. 07/09/23  Yes [provider]  hydrALAZINE  (APRESOLINE ) 25 MG tablet Take 25 mg by mouth in the morning and at bedtime. 03/21/16  Yes [provider]  meclizine (ANTIVERT) 12.5 MG tablet Take 12.5 mg by mouth 2 (two) times daily. 05/26/23  Yes [provider]  telmisartan (MICARDIS) 20 MG tablet Take 20 mg by mouth daily. 07/08/23  Yes [provider]  amLODipine (NORVASC) 10 MG tablet Take 10 mg by mouth.    [provider]  furosemide  (LASIX ) 80 MG tablet Take 1 tablet (80 mg total) by mouth daily as needed for edema (for swelling of legs or other areas take once daily as needed). 05/31/23   Gherghe, Costin M, MD  hydroxychloroquine  (PLAQUENIL ) 200 MG tablet Take 200 mg by mouth 2 (two) times daily.     [provider]  mycophenolate  (MYFORTIC ) 180 MG EC tablet Take 1 tablet (180 mg total) by mouth 2 (two) times daily. 10/13/13   Charlynne Coombes, PA-C  predniSONE  (DELTASONE ) 10 MG tablet Take 5 mg by mouth daily. 10/11/13   [provider]  ranitidine (ZANTAC) 150 MG tablet Take 150 mg by  mouth daily.    [provider]      Allergies    Codeine, Vancomycin , and Amoxicillin    Review of Systems   Review of Systems  All other systems reviewed and are negative.   Physical Exam Updated Vital Signs BP (!) 198/101 (BP Location: Right Arm)   Pulse 81   Temp 98.3 F (36.8 C) (Oral)   Resp 16   Ht 5\' 6"  (1.676 m)   Wt 107.5 kg   LMP 06/21/2023 (Exact Date)   SpO2 100%   BMI 38.25 kg/m  Physical Exam Vitals and nursing note reviewed.  Constitutional:      Appearance: She is well-developed.  HENT:     Head: Atraumatic.  Eyes:      Extraocular Movements: Extraocular movements intact.     Pupils: Pupils are equal, round, and reactive to light.  Cardiovascular:     Rate and Rhythm: Normal rate.  Pulmonary:     Effort: Pulmonary effort is normal.  Musculoskeletal:     Cervical back: Normal range of motion and neck supple.     Right lower leg: Edema present.     Left lower leg: Edema present.  Skin:    General: Skin is warm and dry.  Neurological:     Mental Status: She is alert and oriented to person, place, and time.     ED Results / Procedures / Treatments   Labs (all labs ordered are listed, but only abnormal results are displayed) Labs Reviewed  COMPREHENSIVE METABOLIC PANEL WITH GFR - Abnormal; Notable for the following components:      Result Value   CO2 19 (*)    Glucose, Bld 121 (*)    BUN 126 (*)    Creatinine, Ser 3.43 (*)    Total Protein 5.2 (*)    Albumin 3.0 (*)    GFR, Estimated 17 (*)    All other components within normal limits  URINALYSIS, ROUTINE W REFLEX MICROSCOPIC - Abnormal; Notable for the following components:   APPearance HAZY (*)    Hgb urine dipstick SMALL (*)    Protein, ur >=300 (*)    Leukocytes,Ua TRACE (*)    Bacteria, UA RARE (*)    All other components within normal limits  PROTEIN / CREATININE RATIO, URINE - Abnormal; Notable for the following components:   Protein Creatinine Ratio 4.04 (*)    All other components within normal limits  TROPONIN I (HIGH SENSITIVITY) - Abnormal; Notable for the following components:   Troponin I (High Sensitivity) 24 (*)    All other components within normal limits  TROPONIN I (HIGH SENSITIVITY) - Abnormal; Notable for the following components:   Troponin I (High Sensitivity) 28 (*)    All other components within normal limits  CBC  HCG, SERUM, QUALITATIVE  SODIUM, URINE, RANDOM  ANA W/REFLEX IF POSITIVE  C4 COMPLEMENT  C3 COMPLEMENT  ANTI-DNA ANTIBODY, DOUBLE-STRANDED  ANTI-SMITH ANTIBODY    EKG EKG  Interpretation Date/Time:  Monday July 19 2023 18:22:28 EDT Ventricular Rate:  69 PR Interval:  156 QRS Duration:  82 QT Interval:  358 QTC Calculation: 383 R Axis:   -7  Text Interpretation: Normal sinus rhythm Minimal voltage criteria for LVH, may be normal variant ( R in aVL ) Cannot rule out Anterior infarct , age undetermined Abnormal ECG When compared with ECG of 23-Feb-2006 16:33, PREVIOUS ECG IS PRESENT No acute changes Confirmed by Deatra Face ([id number]) on 07/19/2023 10:00:21 PM  Radiology US  Renal Result  Date: 07/19/2023 CLINICAL DATA:  Renal failure EXAM: RENAL / URINARY TRACT ULTRASOUND COMPLETE COMPARISON:  None Available. FINDINGS: Right Kidney: Renal measurements: 11.4 x 5.7 x 4.7 cm = volume: 159 mL. Echogenic cortex. Multiple, fewer than 10 cysts. The largest is seen at the lower pole and measures 2.9 x 2.7 x 3.1 cm. No imaging follow-up is recommended Left Kidney: Renal measurements: 10.2 x 5.7 x 4.8 cm = volume: 146.7 mL. Echogenic cortex. No hydronephrosis or mass Bladder: Appears normal for degree of bladder distention. Other: None. IMPRESSION: Echogenic renal cortices consistent with medical renal disease. No hydronephrosis. Electronically Signed   By: Esmeralda Hedge M.D.   On: 07/19/2023 22:48   DG Chest 2 View Result Date: 07/19/2023 CLINICAL DATA:  HTN , bilateral lower extremity swelling EXAM: CHEST - 2 VIEW COMPARISON:  January 28, 2023 FINDINGS: No focal airspace consolidation, pleural effusion, or pneumothorax. No cardiomegaly. No acute fracture or destructive lesion. Multilevel thoracic osteophytosis. IMPRESSION: No acute cardiopulmonary abnormality. Electronically Signed   By: Rance Burrows M.D.   On: 07/19/2023 20:02    Procedures .Critical Care  Performed by: Deatra Face, MD Authorized by: Deatra Face, MD   Critical care provider statement:    Critical care time (minutes):  48   Critical care was necessary to treat or prevent imminent or  life-threatening deterioration of the following conditions:  Renal failure and circulatory failure   Critical care was time spent personally by me on the following activities:  Development of treatment plan with patient or surrogate, discussions with consultants, evaluation of patient's response to treatment, examination of patient, ordering and review of laboratory studies, ordering and review of radiographic studies, ordering and performing treatments and interventions, pulse oximetry, re-evaluation of patient's condition, review of old charts and obtaining history from patient or surrogate     Medications Ordered in ED Medications  hydrALAZINE  (APRESOLINE ) injection 10 mg (has no administration in time range)  hydrALAZINE  (APRESOLINE ) tablet 50 mg (50 mg Oral Given 07/19/23 2149)    ED Course/ Medical Decision Making/ A&P                                 Medical Decision Making Amount and/or Complexity of Data Reviewed Labs: ordered. Radiology: ordered.  Risk Prescription drug management.   38 year old patient comes in with chief complaint of headache, weakness, malaise, increasing swelling in her legs, reduced urine output.  She has pertinent past medical history of lupus nephritis.  Patient is managed by rheumatologist in the outpatient setting and is currently on Plaquenil  and prednisone .  She is also managed by [facility].  Renal doctors have been seeing her closely ever since she was discharged in April after a bout of hypovolemia leading to acute on chronic renal failure.  It appears that BP has been elevated for the last several days.  Her BP medications were also adjusted after her recent admission.  I have reviewed patient's records including discharge summary and also noted patient's outpatient renal correspondence with her.  Differential diagnosis for this patient includes acute on chronic renal failure, worsening lupus nephritis, nephrotic syndrome, hypertensive  emergency, uncontrolled hypertension because of worsening renal function.  It appears that patient's BP has been high for the last several days. It also appears that her prednisone  was increased from 5 mg to 60 mg, but there has not been any improvement.  Patient does not have any other lupus symptoms at this  time which is reassuring.  Patient has reassuring neurologic exam.  She is having headache, but low clinical suspicion of brain bleed right now.  Initial plan was to get basic labs and reassess the patient.  I have independently interpreted patient's labs, she has elevated BUN over 130.  I have consulted nephrology, Dr. Austine Blunt indicated that we need to order ultrasound kidney and admit the patient.  They will see the patient tomorrow.  I requested specifically if we need to give patient Solu-Medrol , fluid or Lasix  -any indicated that they will assess the patient tomorrow and make some determination, but not to give patient any IV fluids now.  I consulted medicine for admission.  Patient's BP has been high in the ER.  I gave her oral 50 mg hydralazine , responded.  I will give her 10 mg of hydralazine  at this time.  Clinically, patient is not having hypertensive emergency.  UA does show proteinuria and she has worsening renal function and uremia, but I do not think this is hypertensive emergency, rather this could be blood pressure that is elevated because of poor renal function.  Will defer additional blood pressure management to admitting service.  I did order IV labetalol , but nursing staff indicated that her heart rate was in the 50s therefore, we discontinued that.   Final Clinical Impression(s) / ED Diagnoses Final diagnoses:  Acute renal failure superimposed on chronic kidney disease, unspecified acute renal failure type, unspecified CKD stage (HCC)  Uremia  Proteinuria, unspecified type    Rx / DC Orders ED Discharge Orders     None         Deatra Face, MD 07/20/23  0005

## 2023-07-19 NOTE — ED Provider Triage Note (Signed)
 Emergency Medicine Provider Triage Evaluation Note  Ariel Johns , a 38 y.o. female  was evaluated in triage.  Pt complains of sent by [facility], labs on 07/09/23. Had some things to take care of at home first. Lower extremity edema. Pain in left wrist, purple in the area, has Raynauds but states this is different. Left leg pain, shooting down leg, legs feel like on fire.  On prednisone  60mg . Has been monitoring BP at home, checks BID, BP has been 170s/100 to 220/118.    Review of Systems  Positive: Chest pressure Negative:   Physical Exam  BP (!) 199/110 (BP Location: Right Arm)   Pulse 77   Temp 98.4 F (36.9 C)   Resp 18   Ht 5\' 6"  (1.676 m)   Wt 107.5 kg   LMP 06/21/2023 (Exact Date)   SpO2 98%   BMI 38.25 kg/m  Gen:   Awake, no distress   Resp:  Normal effort  MSK:   Moves extremities without difficulty  Other:    Medical Decision Making  Medically screening exam initiated at 7:24 PM.  Appropriate orders placed.  Ariel Johns was informed that the remainder of the evaluation will be completed by another provider, this initial triage assessment does not replace that evaluation, and the importance of remaining in the ED until their evaluation is complete.     Darlis Eisenmenger, PA-C 07/19/23 1927

## 2023-07-20 ENCOUNTER — Observation Stay (HOSPITAL_COMMUNITY)

## 2023-07-20 DIAGNOSIS — Z885 Allergy status to narcotic agent status: Secondary | ICD-10-CM | POA: Diagnosis not present

## 2023-07-20 DIAGNOSIS — I161 Hypertensive emergency: Secondary | ICD-10-CM | POA: Diagnosis present

## 2023-07-20 DIAGNOSIS — N179 Acute kidney failure, unspecified: Secondary | ICD-10-CM

## 2023-07-20 DIAGNOSIS — E66812 Obesity, class 2: Secondary | ICD-10-CM | POA: Diagnosis present

## 2023-07-20 DIAGNOSIS — Z7952 Long term (current) use of systemic steroids: Secondary | ICD-10-CM | POA: Diagnosis not present

## 2023-07-20 DIAGNOSIS — Z881 Allergy status to other antibiotic agents status: Secondary | ICD-10-CM | POA: Diagnosis not present

## 2023-07-20 DIAGNOSIS — M329 Systemic lupus erythematosus, unspecified: Secondary | ICD-10-CM

## 2023-07-20 DIAGNOSIS — I2489 Other forms of acute ischemic heart disease: Secondary | ICD-10-CM | POA: Diagnosis present

## 2023-07-20 DIAGNOSIS — M3214 Glomerular disease in systemic lupus erythematosus: Secondary | ICD-10-CM | POA: Diagnosis present

## 2023-07-20 DIAGNOSIS — Z880 Allergy status to penicillin: Secondary | ICD-10-CM | POA: Diagnosis not present

## 2023-07-20 DIAGNOSIS — I7300 Raynaud's syndrome without gangrene: Secondary | ICD-10-CM | POA: Diagnosis present

## 2023-07-20 DIAGNOSIS — I129 Hypertensive chronic kidney disease with stage 1 through stage 4 chronic kidney disease, or unspecified chronic kidney disease: Secondary | ICD-10-CM | POA: Diagnosis present

## 2023-07-20 DIAGNOSIS — R001 Bradycardia, unspecified: Secondary | ICD-10-CM | POA: Diagnosis present

## 2023-07-20 DIAGNOSIS — I160 Hypertensive urgency: Secondary | ICD-10-CM | POA: Diagnosis not present

## 2023-07-20 DIAGNOSIS — Z79899 Other long term (current) drug therapy: Secondary | ICD-10-CM | POA: Diagnosis not present

## 2023-07-20 DIAGNOSIS — Z87891 Personal history of nicotine dependence: Secondary | ICD-10-CM | POA: Diagnosis not present

## 2023-07-20 DIAGNOSIS — D649 Anemia, unspecified: Secondary | ICD-10-CM | POA: Diagnosis not present

## 2023-07-20 DIAGNOSIS — D631 Anemia in chronic kidney disease: Secondary | ICD-10-CM | POA: Diagnosis present

## 2023-07-20 DIAGNOSIS — N1832 Chronic kidney disease, stage 3b: Secondary | ICD-10-CM | POA: Diagnosis present

## 2023-07-20 DIAGNOSIS — R0609 Other forms of dyspnea: Secondary | ICD-10-CM

## 2023-07-20 DIAGNOSIS — D6869 Other thrombophilia: Secondary | ICD-10-CM | POA: Diagnosis present

## 2023-07-20 DIAGNOSIS — Z86718 Personal history of other venous thrombosis and embolism: Secondary | ICD-10-CM | POA: Diagnosis not present

## 2023-07-20 DIAGNOSIS — Z6839 Body mass index (BMI) 39.0-39.9, adult: Secondary | ICD-10-CM | POA: Diagnosis not present

## 2023-07-20 DIAGNOSIS — Z79624 Long term (current) use of inhibitors of nucleotide synthesis: Secondary | ICD-10-CM | POA: Diagnosis not present

## 2023-07-20 DIAGNOSIS — K219 Gastro-esophageal reflux disease without esophagitis: Secondary | ICD-10-CM | POA: Diagnosis present

## 2023-07-20 LAB — BASIC METABOLIC PANEL WITH GFR
Anion gap: 9 (ref 5–15)
BUN: 114 mg/dL — ABNORMAL HIGH (ref 6–20)
CO2: 19 mmol/L — ABNORMAL LOW (ref 22–32)
Calcium: 7.9 mg/dL — ABNORMAL LOW (ref 8.9–10.3)
Chloride: 107 mmol/L (ref 98–111)
Creatinine, Ser: 2.91 mg/dL — ABNORMAL HIGH (ref 0.44–1.00)
GFR, Estimated: 20 mL/min — ABNORMAL LOW (ref >60)
Glucose, Bld: 97 mg/dL (ref 70–99)
Potassium: 3.9 mmol/L (ref 3.5–5.1)
Sodium: 135 mmol/L (ref 135–145)

## 2023-07-20 MED ORDER — HYDROXYCHLOROQUINE SULFATE 200 MG PO TABS
200.0000 mg | ORAL_TABLET | Freq: Two times a day (BID) | ORAL | Status: DC
  Administered 2023-07-20 – 2023-07-24 (×10): 200 mg via ORAL
  Filled 2023-07-20 (×13): qty 1

## 2023-07-20 MED ORDER — DILTIAZEM HCL ER COATED BEADS 120 MG PO CP24
120.0000 mg | ORAL_CAPSULE | Freq: Every day | ORAL | Status: DC
  Administered 2023-07-20: 120 mg via ORAL
  Filled 2023-07-20 (×2): qty 1

## 2023-07-20 MED ORDER — SODIUM CHLORIDE 0.9 % IV SOLN: INTRAVENOUS | Status: AC

## 2023-07-20 MED ORDER — HYDRALAZINE HCL 20 MG/ML IJ SOLN
5.0000 mg | INTRAMUSCULAR | Status: DC | PRN
  Filled 2023-07-20: qty 1

## 2023-07-20 MED ORDER — PREDNISONE 20 MG PO TABS
40.0000 mg | ORAL_TABLET | Freq: Every day | ORAL | Status: DC
  Administered 2023-07-21 – 2023-07-24 (×4): 40 mg via ORAL
  Filled 2023-07-20 (×4): qty 2

## 2023-07-20 MED ORDER — ACETAMINOPHEN 650 MG RE SUPP
650.0000 mg | Freq: Four times a day (QID) | RECTAL | Status: DC | PRN
Start: 2023-07-20 — End: 2023-07-25

## 2023-07-20 MED ORDER — PREDNISONE 20 MG PO TABS: 10.0000 mg | ORAL_TABLET | Freq: Every evening | ORAL | Status: DC

## 2023-07-20 MED ORDER — HYDRALAZINE HCL 50 MG PO TABS
50.0000 mg | ORAL_TABLET | Freq: Three times a day (TID) | ORAL | Status: DC
  Administered 2023-07-20 (×2): 50 mg via ORAL
  Filled 2023-07-20 (×2): qty 1

## 2023-07-20 MED ORDER — PREDNISONE 20 MG PO TABS
20.0000 mg | ORAL_TABLET | Freq: Every day | ORAL | Status: DC
  Administered 2023-07-20: 20 mg via ORAL
  Filled 2023-07-20: qty 1

## 2023-07-20 MED ORDER — AMLODIPINE BESYLATE 10 MG PO TABS
10.0000 mg | ORAL_TABLET | Freq: Every day | ORAL | Status: DC
  Administered 2023-07-20 – 2023-07-24 (×5): 10 mg via ORAL
  Filled 2023-07-20 (×3): qty 1
  Filled 2023-07-20: qty 2
  Filled 2023-07-20 (×2): qty 1

## 2023-07-20 MED ORDER — MYCOPHENOLATE SODIUM 180 MG PO TBEC
180.0000 mg | DELAYED_RELEASE_TABLET | Freq: Two times a day (BID) | ORAL | Status: DC
  Administered 2023-07-20 – 2023-07-23 (×7): 180 mg via ORAL
  Filled 2023-07-20 (×8): qty 1

## 2023-07-20 MED ORDER — HYDRALAZINE HCL 25 MG PO TABS
25.0000 mg | ORAL_TABLET | Freq: Three times a day (TID) | ORAL | Status: DC
  Administered 2023-07-20: 25 mg via ORAL
  Filled 2023-07-20: qty 1

## 2023-07-20 MED ORDER — ACETAMINOPHEN 325 MG PO TABS
650.0000 mg | ORAL_TABLET | Freq: Four times a day (QID) | ORAL | Status: DC | PRN
  Administered 2023-07-20 – 2023-07-22 (×3): 650 mg via ORAL
  Filled 2023-07-20 (×3): qty 2

## 2023-07-20 MED ORDER — PREDNISONE 5 MG PO TABS: 10.0000 mg | ORAL_TABLET | ORAL | Status: DC

## 2023-07-20 MED ORDER — CLONIDINE HCL 0.1 MG PO TABS
0.1000 mg | ORAL_TABLET | Freq: Two times a day (BID) | ORAL | Status: DC
  Administered 2023-07-20 – 2023-07-24 (×9): 0.1 mg via ORAL
  Filled 2023-07-20 (×9): qty 1

## 2023-07-20 MED ORDER — HYDRALAZINE HCL 20 MG/ML IJ SOLN
5.0000 mg | INTRAMUSCULAR | Status: DC | PRN
  Administered 2023-07-20: 5 mg via INTRAVENOUS
  Filled 2023-07-20: qty 1

## 2023-07-20 NOTE — Plan of Care (Signed)

## 2023-07-20 NOTE — Progress Notes (Signed)
   07/20/23 1525  TOC Brief Assessment  Insurance and Status Reviewed (BCBS COMM PPO)  Patient has primary care physician Yes [facility]  [id number]   [place], [place] [id number]   [phone])  Home environment has been reviewed From Home with Husband  Prior level of function: independent  Prior/Current Home Services No current home services  Social Drivers of Health Review SDOH reviewed no interventions necessary  Readmission risk has been reviewed Yes (15%)  Transition of care needs no transition of care needs at this time   Please place [facility] consult should any needs arise

## 2023-07-20 NOTE — H&P (Signed)
 History and Physical    Ariel Johns ZOX:[id number] DOB: 1985/05/05 DOA: 07/19/2023  PCP: Patient, No Pcp Per  Patient coming from: Home  Chief Complaint: Elevated blood pressure  HPI: Ariel Johns is a 38 y.o. female with medical history significant of SLE, lupus nephritis, CKD stage IIIa, hypertension, Raynaud's disease, GERD, class II obesity.  She was admitted to the hospital 4/12-4/14/2025 for AKI in the setting of GI illness/hypovolemia and use of diuretics and ARB at home.  Creatinine was 5.5 on admission and previously 1.6 during outpatient office visit in January 2025.  Nephrology consulted and she received IV fluids with improvement of her creatinine to 3.3.  Her home ARB was held and diuretics changed from from scheduled daily to PRN on discharge.  Patient states she was sent to the hospital by her nephrologist.  She saw nephrology on May 29 for evaluation of dyspnea on exertion, bilateral lower extremity edema, and dizziness at work.  Patient showed me lab results from nephrology office visit on 5/29 on her phone which showed BUN 56 and creatinine of 2.3.  States she was advised to come into the hospital to be admitted at that time but she could not come due to personal obligations.  For the past 3 days she is having headaches and blurry vision so she called nephrology again and they advised to come into the hospital.  Her blood pressure has been running high at home with systolic ranging from 170s to 811B.  Her rheumatologist is in [place] and she is being treated with Plaquenil , prednisone , Myfortic  for her lupus.  Patient states she was previously taking prednisone  5 mg daily but nephrology had increased the dose to 60 mg daily on 5/29.  She takes Cardizem and Lasix  40 mg twice daily for hypertension.  Denies chest pain.  Denies vomiting, diarrhea, or abdominal pain.  ED Course: Hypertensive with systolic blood pressure in the 190s.  Slightly bradycardic with heart rate  50-60s.  Labs notable for bicarb 19, BUN 126, creatinine 3.4, troponin 24> 28.  UA with >300 protein, negative nitrate, trace leukocytes, 0-5 RBCs, 6-10 WBCs, and rare bacteria.  Chest x-ray showing no acute cardiopulmonary abnormality.  Renal ultrasound showing echogenic renal cortices consistent with medical renal disease and no hydronephrosis.  EKG showing sinus rhythm, LVH, and no acute ischemic changes.  Nephrology recommended holding off giving IV fluids at this time and will consult in the morning and give further recommendations.  Patient was given oral hydralazine  50 mg but blood pressure remained elevated so subsequently given IV hydralazine  10 mg.   Review of Systems:  Review of Systems  All other systems reviewed and are negative.   Past Medical History:  Diagnosis Date   GERD (gastroesophageal reflux disease)    Hypertension    Lupus nephritis (HCC) 2009   diagnosed at age 21   Raynaud disease     Past Surgical History:  Procedure Laterality Date   lymphnode biopsy     RENAL BIOPSY     TONSILLECTOMY AND ADENOIDECTOMY     age 13   WISDOM TOOTH EXTRACTION       reports that she quit smoking about 9 years ago. She started smoking about 15 years ago. She does not have any smokeless tobacco history on file. She reports current alcohol use of about 1.0 standard drink of alcohol per week. She reports that she does not use drugs.  Allergies  Allergen Reactions   Codeine Hives   Vancomycin  Other (  See Comments)    Adversely affects renal functioning.   Amoxicillin Rash and Other (See Comments)    Sick on stomach    History reviewed. No pertinent family history.  Prior to Admission medications   Medication Sig Start Date End Date Taking? Authorizing Provider  DILT-XR 120 MG 24 hr capsule Take 120 mg by mouth at bedtime. 07/09/23  Yes [provider]  furosemide  (LASIX ) 40 MG tablet Take 40 mg by mouth 2 (two) times daily. 03/21/16  Yes [provider]   hydroxychloroquine  (PLAQUENIL ) 200 MG tablet Take 200 mg by mouth 2 (two) times daily.    Yes [provider]  mycophenolate  (MYFORTIC ) 180 MG EC tablet Take 180 mg by mouth 2 (two) times daily.   Yes [provider]  predniSONE  (DELTASONE ) 20 MG tablet Take 10-20 mg by mouth See admin instructions. Take 2 tablets by mouth in the morning and 1 tablet in the at bedtime   Yes [provider]  ranitidine (ZANTAC) 150 MG tablet Take 150 mg by mouth daily.   Yes [provider]    Physical Exam: Vitals:   07/19/23 2345 07/20/23 0000 07/20/23 0015 07/20/23 0025  BP: (!) 188/92 (!) 188/149 (!) 168/92 (!) 168/92  Pulse: (!) 57 (!) 58 61 (!) 59  Resp:    18  Temp:      TempSrc:      SpO2: 100% 100% 100% 100%  Weight:      Height:        Physical Exam Vitals reviewed.  Constitutional:      General: She is not in acute distress. HENT:     Head: Normocephalic and atraumatic.  Eyes:     Extraocular Movements: Extraocular movements intact.  Cardiovascular:     Rate and Rhythm: Normal rate and regular rhythm.     Pulses: Normal pulses.  Pulmonary:     Effort: Pulmonary effort is normal. No respiratory distress.     Breath sounds: Normal breath sounds. No wheezing or rales.  Abdominal:     General: Bowel sounds are normal. There is no distension.     Palpations: Abdomen is soft.     Tenderness: There is no abdominal tenderness. There is no guarding.  Musculoskeletal:     Cervical back: Normal range of motion.     Right lower leg: No edema.     Left lower leg: No edema.  Skin:    General: Skin is warm and dry.  Neurological:     General: No focal deficit present.     Mental Status: She is alert and oriented to person, place, and time.     Labs on Admission: I have personally reviewed following labs and imaging studies  CBC: Recent Labs  Lab 07/19/23 1935  WBC 9.0  HGB 12.1  HCT 38.3  MCV 83.6  PLT 281   Basic Metabolic Panel: Recent  Labs  Lab 07/19/23 1935  NA 136  K 4.6  CL 104  CO2 19*  GLUCOSE 121*  BUN 126*  CREATININE 3.43*  CALCIUM  9.4   GFR: Estimated Creatinine Clearance: 27.3 mL/min (A) (by C-G formula based on SCr of 3.43 mg/dL (H)). Liver Function Tests: Recent Labs  Lab 07/19/23 1935  AST 19  ALT 22  ALKPHOS 38  BILITOT 0.7  PROT 5.2*  ALBUMIN 3.0*   No results for input(s): "LIPASE", "AMYLASE" in the last 168 hours. No results for input(s): "AMMONIA" in the last 168 hours. Coagulation Profile: No results  for input(s): "INR", "PROTIME" in the last 168 hours. Cardiac Enzymes: No results for input(s): "CKTOTAL", "CKMB", "CKMBINDEX", "TROPONINI" in the last 168 hours. BNP (last 3 results) No results for input(s): "PROBNP" in the last 8760 hours. HbA1C: No results for input(s): "HGBA1C" in the last 72 hours. CBG: No results for input(s): "GLUCAP" in the last 168 hours. Lipid Profile: No results for input(s): "CHOL", "HDL", "LDLCALC", "TRIG", "CHOLHDL", "LDLDIRECT" in the last 72 hours. Thyroid Function Tests: No results for input(s): "TSH", "T4TOTAL", "FREET4", "T3FREE", "THYROIDAB" in the last 72 hours. Anemia Panel: No results for input(s): "VITAMINB12", "FOLATE", "FERRITIN", "TIBC", "IRON", "RETICCTPCT" in the last 72 hours. Urine analysis:    Component Value Date/Time   COLORURINE YELLOW 07/19/2023 2139   APPEARANCEUR HAZY (A) 07/19/2023 2139   LABSPEC 1.011 07/19/2023 2139   LABSPEC 1.030 01/20/2007 1306   PHURINE 5.0 07/19/2023 2139   GLUCOSEU NEGATIVE 07/19/2023 2139   HGBUR SMALL (A) 07/19/2023 2139   BILIRUBINUR NEGATIVE 07/19/2023 2139   BILIRUBINUR Negative 01/20/2007 1306   KETONESUR NEGATIVE 07/19/2023 2139   PROTEINUR >=300 (A) 07/19/2023 2139   UROBILINOGEN 0.2 08/13/2008 1352   NITRITE NEGATIVE 07/19/2023 2139   LEUKOCYTESUR TRACE (A) 07/19/2023 2139   LEUKOCYTESUR Trace 01/20/2007 1306    Radiological Exams on Admission: US  Renal Result Date:  07/19/2023 CLINICAL DATA:  Renal failure EXAM: RENAL / URINARY TRACT ULTRASOUND COMPLETE COMPARISON:  None Available. FINDINGS: Right Kidney: Renal measurements: 11.4 x 5.7 x 4.7 cm = volume: 159 mL. Echogenic cortex. Multiple, fewer than 10 cysts. The largest is seen at the lower pole and measures 2.9 x 2.7 x 3.1 cm. No imaging follow-up is recommended Left Kidney: Renal measurements: 10.2 x 5.7 x 4.8 cm = volume: 146.7 mL. Echogenic cortex. No hydronephrosis or mass Bladder: Appears normal for degree of bladder distention. Other: None. IMPRESSION: Echogenic renal cortices consistent with medical renal disease. No hydronephrosis. Electronically Signed   By: Esmeralda Hedge M.D.   On: 07/19/2023 22:48   DG Chest 2 View Result Date: 07/19/2023 CLINICAL DATA:  HTN , bilateral lower extremity swelling EXAM: CHEST - 2 VIEW COMPARISON:  January 28, 2023 FINDINGS: No focal airspace consolidation, pleural effusion, or pneumothorax. No cardiomegaly. No acute fracture or destructive lesion. Multilevel thoracic osteophytosis. IMPRESSION: No acute cardiopulmonary abnormality. Electronically Signed   By: Rance Burrows M.D.   On: 07/19/2023 20:02    Assessment and Plan  AKI on CKD stage IIIa, history of lupus nephritis Admitted for AKI from dehydration in April with creatinine of 5.5 and it had improved to 2.3 on repeat outpatient labs on 5/29.  Today BUN 126 and creatinine 3.4.  Renal ultrasound showing echogenic renal cortices consistent with medical renal disease and no hydronephrosis. Nephrology consulted and recommended holding off getting IV fluids at this time and will consult in the morning and give further recommendations.  I will hold her home Lasix  as she has no signs of volume overload.  Continue to monitor labs.  Hypertensive urgency SBP initially in the 190s and was given oral and IV hydralazine  in the ED after which SBP improved to 160s. IV hydralazine  PRN SBP >160.  Holding Cardizem at this time due to  mild bradycardia.  Stat CT head ordered given complaints of headaches, dizziness, and blurry vision.  Mild sinus bradycardia Heart rate currently in the low 60s but previously as low as 50s in the ED.  Holding home Cardizem at this time.  Continue cardiac monitoring.  Mild troponin elevation Likely due  to demand ischemia.  Troponin mildly elevated but flat, not consistent with ACS.  EKG without acute ischemic changes and patient is not endorsing chest pain.  Dyspnea on exertion Chest x-ray showing no acute cardiopulmonary abnormality.  No tachycardia, tachypnea, hypoxia, or clinical signs of DVT to suggest PE.  SLE Continue Plaquenil , Myfortic , and prednisone .  DVT prophylaxis: SCDs until CT head is done Code Status: Full Code (discussed with the patient) Consults called: Nephrology Level of care: Progressive Care Unit Admission status: It is my clinical opinion that referral for OBSERVATION is reasonable and necessary in this patient based on the above information provided. The aforementioned taken together are felt to place the patient at high risk for further clinical deterioration. However, it is anticipated that the patient may be medically stable for discharge from the hospital within 24 to 48 hours.  Juliette Oh MD [facility]  If 7PM-7AM, please contact night-coverage [email or web address]  07/20/2023, 1:00 AM

## 2023-07-20 NOTE — Consult Note (Signed)
 [facility] Renal Consultation Note  Requesting MD: Modena Andes, MD Indication for Consultation:  lupus, renal failure   Chief complaint: directed to the ER by nephrology  HPI:  Ariel Johns is a 38 y.o. female with a history of lupus with lupus nephritis, CKD, and hypertension who presented to the hospital at the direction of her nephrologist's office, Dr. Irene Mannheim.  She has seen Dr. Irene Mannheim for years - since 2006 or so.  Last renal biopsy was in 2015.  Note that she was previously admitted in April for AKI felt pre-renal in the setting of GI illness.  She was given fluids at that time and ARB and was held and diuretics were transitioned from scheduled to PRN at that time.  Nephrology advised presenting to the ER after her May appointment however she reports that she was unable to come until now.  Daughter had an appointment that she wanted to contact she states that she also had to work.  She reports that she called the office reporting headaches and blurry vision and was urged to come in so she did.  I do not have access to her outpatient EMR and have requested that notes be faxed to me.  Her mother joined us  via speaker phone.  The patient and I have discussed the risks, benefits, and indications for a renal biopsy and she does consent to a biopsy (when her BP is better controlled).  Her mother states that Dr. Irene Mannheim had a recent renal biopsy planned on 5/21.  She states this was canceled due to hypertension.  Most recently, she states that she has been on dilt, hydralazine  (which she reports is 50 mg BID), and lasix  80 mg BID.  She states that she has been on prednisone  which she reports is at 60 mg daily currently which was added/increased in late May.  Note also myfortic  and hydroxychloroquine .    Lab trends (outpatient):   07/08/23 - Cr 2.33, BUN 56, eGFR 27, K 4.3; up/cr ratio 5811 mg/g, C4 18, C3 82; anti-DSDNA elevated Hb 11.0, alb 3.2  06/09/23 - Cr 2.34, BUN  73; up/cr ratio 2684 mg/g  02/2023 - Cr 1.58, eGFR 33; up/cr ratio 3682 mg/g  11/2022 - Cr 1.35, up/cr ratio 1181 mg/g   Creatinine, Ser  Date/Time Value Ref Range Status  07/20/2023 04:21 AM 2.91 (H) 0.44 - 1.00 mg/dL Final  91/47/8295 62:13 PM 3.43 (H) 0.44 - 1.00 mg/dL Final  08/65/7846 96:29 AM 3.32 (H) 0.44 - 1.00 mg/dL Final  52/84/1324 40:10 AM 4.38 (H) 0.44 - 1.00 mg/dL Final  27/25/3664 40:34 AM 4.83 (H) 0.44 - 1.00 mg/dL Final  74/25/9563 87:56 PM 5.00 (H) 0.44 - 1.00 mg/dL Final  43/32/9518 84:16 AM 5.53 (H) 0.44 - 1.00 mg/dL Final  60/63/0160 10:93 PM 2.13 (H) 0.44 - 1.00 mg/dL Final  23/55/7322 02:54 PM 1.79 (H) 0.44 - 1.00 mg/dL Final  27/07/2374 28:31 AM 0.80 0.50 - 1.10 mg/dL Final  51/76/1607 37:10 AM 0.90 0.50 - 1.10 mg/dL Final  62/69/4854 62:70 AM 0.82 0.50 - 1.10 mg/dL Final  35/00/9381 82:99 AM 0.86 0.50 - 1.10 mg/dL Final  37/16/9678 93:81 AM 0.76 0.50 - 1.10 mg/dL Final  01/75/1025 85:27 AM 1.01 0.50 - 1.10 mg/dL Final  78/24/2353 61:44 AM 1.50 (H) 0.50 - 1.10 mg/dL Final  31/54/0086 76:19 AM 1.67 (H) 0.50 - 1.10 mg/dL Final  50/93/2671 24:58 AM 0.59  Final  02/19/2007 04:15 AM 0.81  Final  02/16/2007 09:12 AM 0.67  Final  02/04/2007 09:23 AM 0.63 0.40 - 1.20 mg/dL Final  84/69/6295 28:41 AM 0.42 DELTA CHECK NOTED  Final  01/22/2007 06:00 AM 0.64  Final  01/20/2007 07:35 PM 0.50  Final  01/20/2007 01:06 PM 0.56 0.40 - 1.20 mg/dL Final  32/44/0102 72:53 AM 0.62  Final     PMHx:   Past Medical History:  Diagnosis Date   GERD (gastroesophageal reflux disease)    Hypertension    Lupus nephritis (HCC) 2009   diagnosed at age 20   Raynaud disease     Past Surgical History:  Procedure Laterality Date   lymphnode biopsy     RENAL BIOPSY     TONSILLECTOMY AND ADENOIDECTOMY     age 20   WISDOM TOOTH EXTRACTION      Family Hx: she denies any known family history of lupus other than an aunt on her father's side with what may be cutaneous lupus.   They state her father's side has had some autoimmune disorders  Social History:  reports that she quit smoking about 9 years ago. She started smoking about 15 years ago. She does not have any smokeless tobacco history on file. She reports current alcohol use of about 1.0 standard drink of alcohol per week. She reports that she does not use drugs.  Allergies:  Allergies  Allergen Reactions   Codeine Hives   Vancomycin  Other (See Comments)    Adversely affects renal functioning.   Amoxicillin Rash and Other (See Comments)    Sick on stomach    Medications: Prior to Admission medications   Medication Sig Start Date End Date Taking? Authorizing Provider  DILT-XR 120 MG 24 hr capsule Take 120 mg by mouth at bedtime. 07/09/23  Yes [provider]  furosemide  (LASIX ) 40 MG tablet Take 40 mg by mouth 2 (two) times daily. 03/21/16  Yes [provider]  hydroxychloroquine  (PLAQUENIL ) 200 MG tablet Take 200 mg by mouth 2 (two) times daily.    Yes [provider]  mycophenolate  (MYFORTIC ) 180 MG EC tablet Take 180 mg by mouth 2 (two) times daily.   Yes [provider]  predniSONE  (DELTASONE ) 20 MG tablet Take 10-20 mg by mouth See admin instructions. Take 2 tablets by mouth in the morning and 1 tablet in the at bedtime   Yes [provider]  ranitidine (ZANTAC) 150 MG tablet Take 150 mg by mouth daily.   Yes [provider]    I have reviewed the patient's current and reported prior to admission medications.  Med rec is incomplete it seems   Labs:     Latest Ref Rng & Units 07/20/2023    4:21 AM 07/19/2023    7:35 PM 05/24/2023    4:27 AM  BMP  Glucose 70 - 99 mg/dL 97  664  403   BUN 6 - 20 mg/dL 474  259  62   Creatinine 0.44 - 1.00 mg/dL 5.63  8.75  6.43   Sodium 135 - 145 mmol/L 135  136  139   Potassium 3.5 - 5.1 mmol/L 3.9  4.6  3.0   Chloride 98 - 111 mmol/L 107  104  96   CO2 22 - 32 mmol/L 19  19  31    Calcium  8.9 - 10.3 mg/dL 7.9   9.4  6.8     Urinalysis    Component Value Date/Time   COLORURINE YELLOW 07/19/2023 2139   APPEARANCEUR HAZY (A) 07/19/2023 2139   LABSPEC 1.011 07/19/2023 2139   LABSPEC  1.030 01/20/2007 1306   PHURINE 5.0 07/19/2023 2139   GLUCOSEU NEGATIVE 07/19/2023 2139   HGBUR SMALL (A) 07/19/2023 2139   BILIRUBINUR NEGATIVE 07/19/2023 2139   BILIRUBINUR Negative 01/20/2007 1306   KETONESUR NEGATIVE 07/19/2023 2139   PROTEINUR >=300 (A) 07/19/2023 2139   UROBILINOGEN 0.2 08/13/2008 1352   NITRITE NEGATIVE 07/19/2023 2139   LEUKOCYTESUR TRACE (A) 07/19/2023 2139   LEUKOCYTESUR Trace 01/20/2007 1306     ROS:  Pertinent items noted in HPI and remainder of comprehensive ROS otherwise negative.  Physical Exam: Vitals:   07/20/23 1051 07/20/23 1117  BP:  (!) 178/117  Pulse:    Resp:    Temp: 98.1 F (36.7 C)   SpO2:       General: adult female in bed in NAD HEENT: NCAT Eyes: EOMI sclera anicteric Neck: supple trachea midline  Heart: S1S2 no rub Lungs: clear to auscultation; normal work of breathing at rest on room air  Abdomen: soft/nt/obese habitus Extremities: no pitting edema  Skin: skin discoloration of her hands Neuro: alert and oriented x 3 provides hx and follows commands  Psych: normal mood and affect GU no foley  Assessment/Plan:  # HTN emergency  - Start clonidine  0.1 mg BID  - I had resumed dilt earlier and primary team has ordered amlodipine.  Will consolidate to amlodipine for now given her heart rate.  The calcium  channel blocker will also help with Raynaud's - She has been started on hydralazine  25 mg TID here - states takes 50 mg BID at home.  Will set at 50 mg TID for now  - Set prednisone  at 40 mg daily for now (from a reported 60 mg daily at home)  - hold lasix  for now  # AKI - Lupus nephritis as well as pre-renal insults.  - some improvement after admission here  - hold lasix   - NS at 75 ml/hr x 12 hours and reassess.  Hopefully improving her  azotemia can help to reduce risk of a bleed - As below, she would benefit from an inpatient renal biopsy after improved control of BP.  Anticipate potential biopsy on Thursday  # Lupus nephritis  - She would benefit from an inpatient renal biopsy after improved control of BP - I do not have access to her outpatient EMR and have requested that notes be faxed to me. - on myfortic  and plaquenil   - on prednisone  - I have decreased dose slightly to 40 mg daily - complement and anti-dsDNA are pending   # CKD stage 3b  - baseline Cr 1.35 - 1.6 from 11/2022 to 02/2023 with recent AKI event  - follows with Dr. Irene Mannheim with [facility]   It appears med rec may still be in process   Please continue inpatient monitoring  Thank you for the consult.  Please do not hesitate to contact me with any questions regarding our patient    Nan Aver 07/20/2023, 12:51 PM

## 2023-07-20 NOTE — Progress Notes (Signed)
 [name] 38 year old female with history of SLE, lupus nephritis and CKD stage IIIa, hypertension and multiple other comorbidities but admitted under hospitalist service after midnight for AKI on CKD secondary to lupus nephritis exacerbation.  Patient seen and examined in the ED.  Husband at the bedside.  Patient appears to be very weak with some tremors in upper extremities.  She is otherwise fully alert and oriented.  She had no complaints.  On examination, had some facial edema as well as bilateral lower extremity edema +3.  Lungs clear to auscultation.  Abdomen soft and nontender.  Also came in with hypertensive urgency.  Blood pressure still elevated.  I have started her on amlodipine 10 mg scheduled p.o. and will increase IV hydralazine  as needed.  Continue PTA medications as well as prednisone .  Nephrology is consulted and plan is to get renal biopsy in next 2 to 3 days.  Her home diltiazem was held by hospitalist due to bradycardia however this has been resumed by nephrology now.  She is not a candidate for ACEI or ARBs.  I will also start her on hydralazine  25 mg 3 times daily. Total time spent 39 minutes

## 2023-07-21 DIAGNOSIS — M329 Systemic lupus erythematosus, unspecified: Secondary | ICD-10-CM

## 2023-07-21 DIAGNOSIS — I160 Hypertensive urgency: Secondary | ICD-10-CM

## 2023-07-21 DIAGNOSIS — D649 Anemia, unspecified: Secondary | ICD-10-CM

## 2023-07-21 DIAGNOSIS — N179 Acute kidney failure, unspecified: Secondary | ICD-10-CM | POA: Diagnosis not present

## 2023-07-21 LAB — CBC
HCT: 34.2 % — ABNORMAL LOW (ref 36.0–46.0)
Hemoglobin: 11.1 g/dL — ABNORMAL LOW (ref 12.0–15.0)
MCH: 26.9 pg (ref 26.0–34.0)
MCHC: 32.5 g/dL (ref 30.0–36.0)
MCV: 83 fL (ref 80.0–100.0)
Platelets: 232 10*3/uL (ref 150–400)
RBC: 4.12 MIL/uL (ref 3.87–5.11)
RDW: 14.6 % (ref 11.5–15.5)
WBC: 9.4 10*3/uL (ref 4.0–10.5)
nRBC: 0 % (ref 0.0–0.2)

## 2023-07-21 LAB — BASIC METABOLIC PANEL WITH GFR
Anion gap: 9 (ref 5–15)
BUN: 114 mg/dL — ABNORMAL HIGH (ref 6–20)
CO2: 20 mmol/L — ABNORMAL LOW (ref 22–32)
Calcium: 7.9 mg/dL — ABNORMAL LOW (ref 8.9–10.3)
Chloride: 106 mmol/L (ref 98–111)
Creatinine, Ser: 3.07 mg/dL — ABNORMAL HIGH (ref 0.44–1.00)
GFR, Estimated: 19 mL/min — ABNORMAL LOW (ref >60)
Glucose, Bld: 101 mg/dL — ABNORMAL HIGH (ref 70–99)
Potassium: 4 mmol/L (ref 3.5–5.1)
Sodium: 135 mmol/L (ref 135–145)

## 2023-07-21 LAB — ENA+DNA/DS+ANTICH+CENTRO+JO...
Anti JO-1: <0.2 AI (ref 0.0–0.9)
Centromere Ab Screen: <0.2 AI (ref 0.0–0.9)
Chromatin Ab SerPl-aCnc: >8 AI — ABNORMAL HIGH (ref 0.0–0.9)
ENA SM Ab Ser-aCnc: 0.3 AI (ref 0.0–0.9)
Ribonucleic Protein: 3.9 AI — ABNORMAL HIGH (ref 0.0–0.9)
SSA (Ro) (ENA) Antibody, IgG: 0.2 AI (ref 0.0–0.9)
SSB (La) (ENA) Antibody, IgG: <0.2 AI (ref 0.0–0.9)
Scleroderma (Scl-70) (ENA) Antibody, IgG: <0.2 AI (ref 0.0–0.9)
ds DNA Ab: 138 [IU]/mL — ABNORMAL HIGH (ref 0–9)

## 2023-07-21 LAB — ANA W/REFLEX IF POSITIVE: Anti Nuclear Antibody (ANA): POSITIVE — AB

## 2023-07-21 LAB — ANTI-DNA ANTIBODY, DOUBLE-STRANDED: ds DNA Ab: 150 [IU]/mL — ABNORMAL HIGH (ref 0–9)

## 2023-07-21 LAB — ANTI-SMITH ANTIBODY: ENA SM Ab Ser-aCnc: 0.3 AI (ref 0.0–0.9)

## 2023-07-21 MED ORDER — HYDRALAZINE HCL 25 MG PO TABS
25.0000 mg | ORAL_TABLET | Freq: Three times a day (TID) | ORAL | Status: DC
  Administered 2023-07-21 (×3): 25 mg via ORAL
  Filled 2023-07-21 (×4): qty 1

## 2023-07-21 MED ORDER — SODIUM CHLORIDE 0.9 % IV SOLN: INTRAVENOUS | Status: DC

## 2023-07-21 MED ORDER — HEPARIN SODIUM (PORCINE) 5000 UNIT/ML IJ SOLN
5000.0000 [IU] | Freq: Three times a day (TID) | INTRAMUSCULAR | Status: DC
  Administered 2023-07-21 – 2023-07-25 (×10): 5000 [IU] via SUBCUTANEOUS
  Filled 2023-07-21 (×11): qty 1

## 2023-07-21 NOTE — Progress Notes (Signed)
 [facility] Progress Note  Name: Ariel Johns MRN: [id number] DOB: Jul 13, 1985  Chief Complaint:  Directed to ER per nephrology for vision changes/worsening labs  Subjective:  Reviewed outpatient EMR and obtained additional history 6/10 PM.  She has had a history of prior struggles with compliance.  Per charting, the patient has depression and has previously declined Benlysta due to the same.  She follows with rheumatology Dr. Ebbie Goldmann however has not seen him as frequently as we have advised.  She had a biopsy in 2009 and was treated with Cytoxan  in 2009; she was then maintained on prednisone  and CellCept.  She then had repeat Cytoxan  (low dose) in 01/2014.  She developed recurrence of nephrotic syndrome in 2017 in the setting of being off of her medication for at least 2 weeks.  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  Note that as of 06/09/2023 her prednisone  was at 10 mg daily.  She had been advised to present to the ER after her 07/08/23 office visit and on 07/09/2023, her prednisone  was increased to 60 mg daily.  Per charting, the patient is not on hormonal birth control and is not desiring future pregnancies.  She has been counseled on the teratogenic effects of her medications and understands the risks.  Pharmacy was not able to locate a recent prescription for hydralazine  (after 01/2023).  Question if she meant hydroxychloroquine .    No urine output is charted over 6/10.  She states that this is the first morning in three weeks that she woke up and felt well.  She's had vision issues previously.  She had some bradycardia at home with HR in the 50's (which would've limited up-titration of her dilt).  She and I discussed risks/benefits/indications for renal biopsy and she does agree to a renal biopsy.   Review of systems:  Reports Shortness of breath with exertion  Denies n/v Denies chest pain    ------------------------------ Background on consult:  Ariel Johns is a 38 y.o. female with a history of lupus with lupus nephritis, CKD, and hypertension who presented to the hospital at the direction of her nephrologist's office, Dr. Irene Mannheim.  She has seen Dr. Irene Mannheim for years - since 2006 or so.  Last renal biopsy was in 2015.  Note that she was previously admitted in April for AKI felt pre-renal in the setting of GI illness.  She was given fluids at that time and ARB and was held and diuretics were transitioned from scheduled to PRN at that time.  Nephrology advised presenting to the ER after her May appointment however she reports that she was unable to come until now.  Daughter had an appointment that she wanted to contact she states that she also had to work.  She reports that she called the office reporting headaches and blurry vision and was urged to come in so she did.  I do not have access to her outpatient EMR and have requested that notes be faxed to me.  Her mother joined us  via speaker phone.  The patient and I have discussed the risks, benefits, and indications for a renal biopsy and she does consent to a biopsy (when her BP is better controlled).  Her mother states that Dr. Irene Mannheim had a recent renal biopsy planned on 5/21.  She states this was canceled due to hypertension.  Most recently, she states that she has been on dilt, hydralazine  (which she reports is 50 mg  BID), and lasix  80 mg BID.  She states that she has been on prednisone  which she reports is at 60 mg daily currently which was added/increased in late May.  Note also myfortic  and hydroxychloroquine .    Lab trends (outpatient):   07/08/23 - Cr 2.33, BUN 56, eGFR 27, K 4.3; up/cr ratio 5811 mg/g, C4 18, C3 82; anti-DSDNA elevated Hb 11.0, alb 3.2   06/09/23 - Cr 2.34, BUN 73; up/cr ratio 2684 mg/g   02/2023 - Cr 1.58, eGFR 33; up/cr ratio 3682 mg/g   11/2022 - Cr 1.35, up/cr ratio 1181  mg/g    Intake/Output Summary (Last 24 hours) at 07/21/2023 1258 Last data filed at 07/21/2023 0323 Gross per 24 hour  Intake 921.25 ml  Output --  Net 921.25 ml    Vitals:  Vitals:   07/21/23 0408 07/21/23 0827 07/21/23 1000 07/21/23 1235  BP: 122/89 (!) 142/79 (!) 142/79 119/75  Pulse: (!) 56 60  (!) 58  Resp:  18  16  Temp: 98.1 F (36.7 C) (!) 97.5 F (36.4 C)  97.7 F (36.5 C)  TempSrc: Oral Axillary  Oral  SpO2:  99%  99%  Weight:      Height:         Physical Exam:  General adult female in bed in no acute distress HEENT normocephalic atraumatic extraocular movements intact sclera anicteric Neck supple trachea midline Lungs clear to auscultation bilaterally normal work of breathing at rest  Heart S1S2 no rub Abdomen soft nontender nondistended Extremities no edema lower extremities  Psych normal mood and affect Neuro alert and oriented x 3 provides hx and follows commands  Skin raynaud's - hands   Medications reviewed   Labs:     Latest Ref Rng & Units 07/21/2023    8:25 AM 07/20/2023    4:21 AM 07/19/2023    7:35 PM  BMP  Glucose 70 - 99 mg/dL 478  97  295   BUN 6 - 20 mg/dL 621  308  657   Creatinine 0.44 - 1.00 mg/dL 8.46  9.62  9.52   Sodium 135 - 145 mmol/L 135  135  136   Potassium 3.5 - 5.1 mmol/L 4.0  3.9  4.6   Chloride 98 - 111 mmol/L 106  107  104   CO2 22 - 32 mmol/L 20  19  19    Calcium  8.9 - 10.3 mg/dL 7.9  7.9  9.4      Assessment/Plan:    # HTN emergency  - We have started clonidine  0.1 mg BID  - I had resumed dilt earlier and primary team has ordered amlodipine.  Will consolidate to amlodipine for now given her heart rate.  The calcium  channel blocker will also help with Raynaud's - She has been started on hydralazine  25 mg TID here and then I transiently increased to 50 mg TID and BP much improved today.  Unclear if she was actually taking at home.  I reduced back to 25 mg TID for now and we will reassess - Set prednisone  at 40 mg  daily for now (reduction from a reported 60 mg daily at home)  - hold lasix  for now   # AKI - Lupus nephritis as well as pre-renal insults.  - some improvement after admission here  - hold lasix   - NS at 75 ml/hr x 24 hours and reassess.  Hopefully improving her azotemia can help to reduce risk of a bleed - As below, she would benefit  from an inpatient renal biopsy.   - May need DDAVP tomorrow  - Will make NPO after midnight tonight - I have consulted IR for renal biopsy on 6/12 - Anticipate potential biopsy on Thursday   # Lupus nephritis  - She would benefit from an inpatient renal biopsy. BP is improved  - on myfortic  and plaquenil   - on prednisone  - I have decreased dose slightly to 40 mg daily - complement pending and anti-dsDNA elevated    # CKD stage 3b  - baseline Cr 1.35 - 1.6 from 11/2022 to 02/2023 with recent AKI event  - follows with Dr. Irene Mannheim with [facility]    Disposition - continue inpatient monitoring   Nan Aver, MD 07/21/2023 1:51 PM

## 2023-07-21 NOTE — Progress Notes (Signed)
 PROGRESS NOTE        PATIENT DETAILS Name: Ariel Johns Age: 38 y.o. Sex: female Date of Birth: February 08, 1986 Admit Date: 07/19/2023 Admitting Physician Modena Andes, MD [id number], No Pcp Per  Brief Summary: Patient is a 38 y.o.  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  Significant events: 6/10>> admit to [facility]  Significant studies: 6/9>> CXR: No pneumonia 6/9>> renal ultrasound: No hydronephrosis 6/9>> UA:> 300 protein. 6/9>> urine protein/creatinine ratio: 4.04. 6/9>>ANA: Pending 6/9>> anti-dsDNA: Pending 6/9>> C3/C4: Pending 6/10>> CT head: No acute intracranial abnormality  Significant microbiology data: None  Procedures: None  Consults: Nephrology  Subjective: Lying comfortably in bed-denies any chest pain or shortness of breath.  Objective: Vitals: Blood pressure (!) 142/79, pulse 60, temperature (!) 97.5 F (36.4 C), temperature source Axillary, resp. rate 18, height 5' 6 (1.676 m), weight 107.5 kg, last menstrual period 06/21/2023, SpO2 99%.   Exam: Gen Exam:Alert awake-not in any distress HEENT:atraumatic, normocephalic Chest: B/L clear to auscultation anteriorly CVS:S1S2 regular Abdomen:soft non tender, non distended Extremities:no edema Neurology: Non focal Skin: no rash  Pertinent Labs/Radiology:    Latest Ref Rng & Units 07/21/2023    8:25 AM 07/19/2023    7:35 PM 06/30/2023    6:42 AM  CBC  WBC 4.0 - 10.5 K/uL 9.4  9.0  4.4   Hemoglobin 12.0 - 15.0 g/dL 09.8  11.9  14.7   Hematocrit 36.0 - 46.0 % 34.2  38.3  31.4   Platelets 150 - 400 K/uL 232  281  206     Lab Results  Component Value Date   NA 135 07/20/2023   K 3.9 07/20/2023   CL 107 07/20/2023   CO2 19 (L) 07/20/2023      Assessment/Plan: Hypertensive  emergency BP control much better-likely provoked by worsening renal function  HTN BP controlled this morning Continue amlodipine/clonidine /hydralazine  Given concern for lupus nephritis-May need to switch hydralazine  to an alternative agent.  AKI on CKD stage IIIa AKI felt to be multifactorial-possible uncontrolled hypertension-underlying lupus nephritis Creatinine slowly improving with better blood pressure control Lupus serology pending Nephrology contemplating biopsy 6/12.  History of lupus nephritis Inpatient renal biopsy planned for 6/12 Remains on Plaquenil /prednisone /Myfortic   Normocytic anemia Mild Likely secondary to acute illness/CKD Trend CBC periodically  Class 2 Obesity: Estimated body mass index is 38.25 kg/m as calculated from the following:   Height as of this encounter: 5' 6 (1.676 m).   Weight as of this encounter: 107.5 kg.   Code status:   Code Status: Full Code   DVT Prophylaxis: heparin  injection 5,000 Units Start: 07/21/23 1400 SCDs Start: 07/20/23 0227   Family Communication:  None at bedside   Disposition Plan: Status is: Inpatient Remains inpatient appropriate because: Severity of illness   Planned Discharge Destination:Home   Diet: Diet Order             Diet Heart Room service appropriate? Yes; Fluid consistency: Thin  Diet effective now                     Antimicrobial agents: Anti-infectives (From admission, onward)    Start     Dose/Rate Route Frequency Ordered Stop  07/20/23 0400  hydroxychloroquine  (PLAQUENIL ) tablet 200 mg        200 mg Oral 2 times daily 07/20/23 0225          MEDICATIONS: Scheduled Meds:  amLODipine  10 mg Oral Daily   cloNIDine   0.1 mg Oral BID   heparin  injection (subcutaneous)  5,000 Units Subcutaneous Q8H   hydrALAZINE   25 mg Oral TID   hydroxychloroquine   200 mg Oral BID   mycophenolate   180 mg Oral BID   predniSONE   40 mg Oral Q breakfast   Continuous Infusions: PRN  Meds:.acetaminophen  **OR** acetaminophen , hydrALAZINE    I have personally reviewed following labs and imaging studies  LABORATORY DATA: CBC: Recent Labs  Lab 07/19/23 1935 07/21/23 0825  WBC 9.0 9.4  HGB 12.1 11.1*  HCT 38.3 34.2*  MCV 83.6 83.0  PLT 281 232    Basic Metabolic Panel: Recent Labs  Lab 07/19/23 1935 07/20/23 0421  NA 136 135  K 4.6 3.9  CL 104 107  CO2 19* 19*  GLUCOSE 121* 97  BUN 126* 114*  CREATININE 3.43* 2.91*  CALCIUM  9.4 7.9*    GFR: Estimated Creatinine Clearance: 32.2 mL/min (A) (by C-G formula based on SCr of 2.91 mg/dL (H)).  Liver Function Tests: Recent Labs  Lab 07/19/23 1935  AST 19  ALT 22  ALKPHOS 38  BILITOT 0.7  PROT 5.2*  ALBUMIN 3.0*   No results for input(s): LIPASE, AMYLASE in the last 168 hours. No results for input(s): AMMONIA in the last 168 hours.  Coagulation Profile: No results for input(s): INR, PROTIME in the last 168 hours.  Cardiac Enzymes: No results for input(s): CKTOTAL, CKMB, CKMBINDEX, TROPONINI in the last 168 hours.  BNP (last 3 results) No results for input(s): PROBNP in the last 8760 hours.  Lipid Profile: No results for input(s): CHOL, HDL, LDLCALC, TRIG, CHOLHDL, LDLDIRECT in the last 72 hours.  Thyroid Function Tests: No results for input(s): TSH, T4TOTAL, FREET4, T3FREE, THYROIDAB in the last 72 hours.  Anemia Panel: No results for input(s): VITAMINB12, FOLATE, FERRITIN, TIBC, IRON, RETICCTPCT in the last 72 hours.  Urine analysis:    Component Value Date/Time   COLORURINE YELLOW 07/19/2023 2139   APPEARANCEUR HAZY (A) 07/19/2023 2139   LABSPEC 1.011 07/19/2023 2139   LABSPEC 1.030 01/20/2007 1306   PHURINE 5.0 07/19/2023 2139   GLUCOSEU NEGATIVE 07/19/2023 2139   HGBUR SMALL (A) 07/19/2023 2139   BILIRUBINUR NEGATIVE 07/19/2023 2139   BILIRUBINUR Negative 01/20/2007 1306   KETONESUR NEGATIVE 07/19/2023 2139   PROTEINUR  >=300 (A) 07/19/2023 2139   UROBILINOGEN 0.2 08/13/2008 1352   NITRITE NEGATIVE 07/19/2023 2139   LEUKOCYTESUR TRACE (A) 07/19/2023 2139   LEUKOCYTESUR Trace 01/20/2007 1306    Sepsis Labs: Lactic Acid, Venous No results found for: LATICACIDVEN  MICROBIOLOGY: No results found for this or any previous visit (from the past 240 hours).  RADIOLOGY STUDIES/RESULTS: CT HEAD WO CONTRAST ( ) Result Date: 07/20/2023 CLINICAL DATA:  Sudden severe headache and hypertension EXAM: CT HEAD WITHOUT CONTRAST TECHNIQUE: Contiguous axial images were obtained from the base of the skull through the vertex without intravenous contrast. RADIATION DOSE REDUCTION: This exam was performed according to the departmental dose-optimization program which includes automated exposure control, adjustment of the mA and/or kV according to patient size and/or use of iterative reconstruction technique. COMPARISON:  CT head 01/23/2009 FINDINGS: Brain: No intracranial hemorrhage, mass effect, or evidence of acute infarct. No hydrocephalus. No extra-axial fluid collection. Chronic left temporoparietal encephalomalacia. Vascular: No  hyperdense vessel or unexpected calcification. Skull: No fracture or focal lesion. Sinuses/Orbits: No acute finding. Other: None. IMPRESSION: No acute intracranial abnormality. Chronic left temporoparietal encephalomalacia. Electronically Signed   By: Rozell Cornet M.D.   On: 07/20/2023 03:08   US  Renal Result Date: 07/19/2023 CLINICAL DATA:  Renal failure EXAM: RENAL / URINARY TRACT ULTRASOUND COMPLETE COMPARISON:  None Available. FINDINGS: Right Kidney: Renal measurements: 11.4 x 5.7 x 4.7 cm = volume: 159 mL. Echogenic cortex. Multiple, fewer than 10 cysts. The largest is seen at the lower pole and measures 2.9 x 2.7 x 3.1 cm. No imaging follow-up is recommended Left Kidney: Renal measurements: 10.2 x 5.7 x 4.8 cm = volume: 146.7 mL. Echogenic cortex. No hydronephrosis or mass Bladder: Appears normal  for degree of bladder distention. Other: None. IMPRESSION: Echogenic renal cortices consistent with medical renal disease. No hydronephrosis. Electronically Signed   By: Esmeralda Hedge M.D.   On: 07/19/2023 22:48   DG Chest 2 View Result Date: 07/19/2023 CLINICAL DATA:  HTN , bilateral lower extremity swelling EXAM: CHEST - 2 VIEW COMPARISON:  January 28, 2023 FINDINGS: No focal airspace consolidation, pleural effusion, or pneumothorax. No cardiomegaly. No acute fracture or destructive lesion. Multilevel thoracic osteophytosis. IMPRESSION: No acute cardiopulmonary abnormality. Electronically Signed   By: Rance Burrows M.D.   On: 07/19/2023 20:02     LOS: 1 day   Kimberly Penna, MD  [facility]    To contact the attending provider between 7A-7P or the covering provider during after hours 7P-7A, please log into the web site [email or web address] and access using universal [place] password for that web site. If you do not have the password, please call the hospital operator.  07/21/2023, 9:08 AM

## 2023-07-21 NOTE — Plan of Care (Signed)
  Problem: Clinical Measurements: Goal: Ability to maintain clinical measurements within normal limits will improve Outcome: Progressing   Problem: Education: Goal: Knowledge of General Education information will improve Description: Including pain rating scale, medication(s)/side effects and non-pharmacologic comfort measures Outcome: Progressing   

## 2023-07-22 ENCOUNTER — Inpatient Hospital Stay (HOSPITAL_COMMUNITY)

## 2023-07-22 ENCOUNTER — Encounter (HOSPITAL_COMMUNITY): Payer: Self-pay | Admitting: Family Medicine

## 2023-07-22 DIAGNOSIS — N179 Acute kidney failure, unspecified: Secondary | ICD-10-CM | POA: Diagnosis not present

## 2023-07-22 DIAGNOSIS — D649 Anemia, unspecified: Secondary | ICD-10-CM | POA: Diagnosis not present

## 2023-07-22 DIAGNOSIS — I160 Hypertensive urgency: Secondary | ICD-10-CM | POA: Diagnosis not present

## 2023-07-22 DIAGNOSIS — M329 Systemic lupus erythematosus, unspecified: Secondary | ICD-10-CM | POA: Diagnosis not present

## 2023-07-22 LAB — C3 COMPLEMENT: C3 Complement: 89 mg/dL (ref 82–167)

## 2023-07-22 LAB — HEMOGLOBIN A1C
Hgb A1c MFr Bld: 5.3 % (ref 4.8–5.6)
Mean Plasma Glucose: 105.41 mg/dL

## 2023-07-22 LAB — CBC
HCT: 33.2 % — ABNORMAL LOW (ref 36.0–46.0)
Hemoglobin: 10.5 g/dL — ABNORMAL LOW (ref 12.0–15.0)
MCH: 26.8 pg (ref 26.0–34.0)
MCHC: 31.6 g/dL (ref 30.0–36.0)
MCV: 84.7 fL (ref 80.0–100.0)
Platelets: 215 10*3/uL (ref 150–400)
RBC: 3.92 MIL/uL (ref 3.87–5.11)
RDW: 14.6 % (ref 11.5–15.5)
WBC: 8.8 10*3/uL (ref 4.0–10.5)
nRBC: 0 % (ref 0.0–0.2)

## 2023-07-22 LAB — RENAL FUNCTION PANEL
Albumin: 2.3 g/dL — ABNORMAL LOW (ref 3.5–5.0)
Anion gap: 9 (ref 5–15)
BUN: 111 mg/dL — ABNORMAL HIGH (ref 6–20)
CO2: 19 mmol/L — ABNORMAL LOW (ref 22–32)
Calcium: 8 mg/dL — ABNORMAL LOW (ref 8.9–10.3)
Chloride: 111 mmol/L (ref 98–111)
Creatinine, Ser: 2.85 mg/dL — ABNORMAL HIGH (ref 0.44–1.00)
GFR, Estimated: 21 mL/min — ABNORMAL LOW (ref >60)
Glucose, Bld: 81 mg/dL (ref 70–99)
Phosphorus: 5.1 mg/dL — ABNORMAL HIGH (ref 2.5–4.6)
Potassium: 4.4 mmol/L (ref 3.5–5.1)
Sodium: 139 mmol/L (ref 135–145)

## 2023-07-22 LAB — PROTIME-INR
INR: 1 (ref 0.8–1.2)
Prothrombin Time: 13.6 s (ref 11.4–15.2)

## 2023-07-22 LAB — C4 COMPLEMENT: Complement C4, Body Fluid: 13 mg/dL (ref 12–38)

## 2023-07-22 MED ORDER — GELATIN ABSORBABLE 12-7 MM EX MISC
1.0000 | Freq: Once | CUTANEOUS | Status: AC
  Administered 2023-07-22: 1 via TOPICAL

## 2023-07-22 MED ORDER — HYDRALAZINE HCL 50 MG PO TABS
50.0000 mg | ORAL_TABLET | Freq: Three times a day (TID) | ORAL | Status: DC
  Administered 2023-07-22 – 2023-07-24 (×7): 50 mg via ORAL
  Filled 2023-07-22 (×7): qty 1

## 2023-07-22 MED ORDER — VITAMIN D 25 MCG (1000 UNIT) PO TABS
2000.0000 [IU] | ORAL_TABLET | Freq: Every day | ORAL | Status: DC
  Administered 2023-07-22 – 2023-07-24 (×3): 2000 [IU] via ORAL
  Filled 2023-07-22 (×4): qty 2

## 2023-07-22 MED ORDER — MIDAZOLAM HCL 2 MG/2ML IJ SOLN
INTRAMUSCULAR | Status: AC | PRN
  Administered 2023-07-22 (×2): 1 mg via INTRAVENOUS

## 2023-07-22 MED ORDER — PANTOPRAZOLE SODIUM 40 MG PO TBEC
40.0000 mg | DELAYED_RELEASE_TABLET | Freq: Every day | ORAL | Status: DC
  Administered 2023-07-22 – 2023-07-24 (×3): 40 mg via ORAL
  Filled 2023-07-22 (×4): qty 1

## 2023-07-22 MED ORDER — LIDOCAINE HCL (PF) 1 % IJ SOLN
10.0000 mL | Freq: Once | INTRAMUSCULAR | Status: AC
  Administered 2023-07-22: 10 mL via INTRADERMAL

## 2023-07-22 MED ORDER — FENTANYL CITRATE (PF) 100 MCG/2ML IJ SOLN
INTRAMUSCULAR | Status: AC
Start: 2023-07-22 — End: 2023-07-22
  Filled 2023-07-22: qty 2

## 2023-07-22 MED ORDER — FENTANYL CITRATE (PF) 100 MCG/2ML IJ SOLN
INTRAMUSCULAR | Status: AC | PRN
  Administered 2023-07-22: 50 ug via INTRAVENOUS

## 2023-07-22 MED ORDER — MIDAZOLAM HCL 2 MG/2ML IJ SOLN
INTRAMUSCULAR | Status: AC
  Filled 2023-07-22: qty 2

## 2023-07-22 MED ORDER — MAGNESIUM SULFATE 2 GM/50ML IV SOLN
2.0000 g | Freq: Once | INTRAVENOUS | Status: AC
  Administered 2023-07-22: 2 g via INTRAVENOUS
  Filled 2023-07-22: qty 50

## 2023-07-22 MED ORDER — SODIUM CHLORIDE 0.9 % IV SOLN
0.3000 ug/kg | Freq: Once | INTRAVENOUS | Status: AC
  Administered 2023-07-22: 32 ug via INTRAVENOUS
  Filled 2023-07-22: qty 8

## 2023-07-22 NOTE — Progress Notes (Signed)
 MEDICATION-RELATED CONSULT NOTE   IR Procedure Consult - Anticoagulant/Antiplatelet PTA/Inpatient Med List Review by Pharmacist    Procedure: Renal biopsy bland US  Guided Biopsy of left kidney     Completed: 07/22/23 at 12:18  Post-Procedural bleeding risk per IR MD assessment:  standard  Antithrombotic medications on inpatient or PTA profile prior to procedure:     Heparin  5000 units SQ q8h  Recommended restart time per IR Post-Procedure Guidelines:   Day 0  (at least 6 hours or at next standard dose interval)     Other considerations:      Plan:    Resume SQ heparin  at 22:00 as currently ordered.  Armanda Bern, PharmD, BCPS 07/22/2023 2:01 PM

## 2023-07-22 NOTE — Progress Notes (Signed)
 PROGRESS NOTE        PATIENT DETAILS Name: Ariel Johns Age: 38 y.o. Sex: female Date of Birth: [birth year] Admit Date: 07/19/2023 Admitting Physician Modena Andes, MD [id number], No Pcp Per  Brief Summary: Patient is a 38 y.o.  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  Significant events: 6/10>> admit to [facility]  Significant studies: 6/9>> CXR: No pneumonia 6/9>> renal ultrasound: No hydronephrosis 6/9>> UA:> 300 protein. 6/9>> urine protein/creatinine ratio: 4.04. 6/9>>ANA: +ve 6/9>> anti-dsDNA: 150 (elevated) 6/9>> C3/C4: normal limits 6/10>> CT head: No acute intracranial abnormality  Significant microbiology data: None  Procedures: None  Consults: Nephrology  Subjective: No complaints-awaiting renal biopsy.  No nausea or vomiting.  No headache.  Objective: Vitals: Blood pressure (!) 146/80, pulse 63, temperature 97.7 F (36.5 C), temperature source Oral, resp. rate 18, height 5' 6 (1.676 m), weight 107.3 kg, last menstrual period 06/21/2023, SpO2 96%.   Exam: Awake/alert Chest: Clear to auscultation CVS: S1-S2 regular Abdomen: Soft nontender nondistended Extremities: Trace edema.  Pertinent Labs/Radiology:    Latest Ref Rng & Units 07/21/2023    8:25 AM 07/19/2023    7:35 PM 06/30/2023    6:42 AM  CBC  WBC 4.0 - 10.5 K/uL 9.4  9.0  4.4   Hemoglobin 12.0 - 15.0 g/dL 84.1  32.4  40.1   Hematocrit 36.0 - 46.0 % 34.2  38.3  31.4   Platelets 150 - 400 K/uL 232  281  206     Lab Results  Component Value Date   NA 135 07/21/2023   K 4.0 07/21/2023   CL 106 07/21/2023   CO2 20 (L) 07/21/2023      Assessment/Plan: Hypertensive emergency BP control much better-likely provoked by worsening renal function  HTN BP significantly  better controlled this morning Continue current dosing of amlodipine/clonidine /hydralazine   AKI on CKD stage IIIa AKI felt to be multifactorial-possible uncontrolled hypertension-underlying lupus nephritis Creatinine slowly improving with better blood pressure control Lupus serology with elevated dsDNA-but normal complements Renal biopsy scheduled for later today. am labs pending-nephrology following.  History of lupus nephritis Inpatient renal biopsy planned for 6/12 Remains on Plaquenil /prednisone /Myfortic   Normocytic anemia Mild Likely secondary to acute illness/CKD Trend CBC periodically  Class 2 Obesity: Estimated body mass index is 38.18 kg/m as calculated from the following:   Height as of this encounter: 5' 6 (1.676 m).   Weight as of this encounter: 107.3 kg.   Code status:   Code Status: Full Code   DVT Prophylaxis: heparin  injection 5,000 Units Start: 07/21/23 1400 SCDs Start: 07/20/23 0227   Family Communication:  Spouse at bedside.   Disposition Plan: Status is: Inpatient Remains inpatient appropriate because: Severity of illness   Planned Discharge Destination:Home   Diet: Diet Order             Diet NPO time specified Except for: Sips with Meds  Diet effective midnight                     Antimicrobial agents: Anti-infectives (From admission, onward)    Start     Dose/Rate Route Frequency Ordered Stop   07/20/23 0400  hydroxychloroquine  (PLAQUENIL ) tablet 200 mg  200 mg Oral 2 times daily 07/20/23 0225          MEDICATIONS: Scheduled Meds:  amLODipine  10 mg Oral Daily   cloNIDine   0.1 mg Oral BID   heparin  injection (subcutaneous)  5,000 Units Subcutaneous Q8H   hydrALAZINE   25 mg Oral TID   hydroxychloroquine   200 mg Oral BID   mycophenolate   180 mg Oral BID   predniSONE   40 mg Oral Q breakfast   Continuous Infusions:  sodium chloride  75 mL/hr at 07/22/23 0432   PRN Meds:.acetaminophen  **OR** acetaminophen ,  hydrALAZINE    I have personally reviewed following labs and imaging studies  LABORATORY DATA: CBC: Recent Labs  Lab 07/19/23 1935 07/21/23 0825  WBC 9.0 9.4  HGB 12.1 11.1*  HCT 38.3 34.2*  MCV 83.6 83.0  PLT 281 232    Basic Metabolic Panel: Recent Labs  Lab 07/19/23 1935 07/20/23 0421 07/21/23 0825  NA 136 135 135  K 4.6 3.9 4.0  CL 104 107 106  CO2 19* 19* 20*  GLUCOSE 121* 97 101*  BUN 126* 114* 114*  CREATININE 3.43* 2.91* 3.07*  CALCIUM  9.4 7.9* 7.9*    GFR: Estimated Creatinine Clearance: 30.5 mL/min (A) (by C-G formula based on SCr of 3.07 mg/dL (H)).  Liver Function Tests: Recent Labs  Lab 07/19/23 1935  AST 19  ALT 22  ALKPHOS 38  BILITOT 0.7  PROT 5.2*  ALBUMIN 3.0*   No results for input(s): LIPASE, AMYLASE in the last 168 hours. No results for input(s): AMMONIA in the last 168 hours.  Coagulation Profile: Recent Labs  Lab 07/22/23 0747  INR 1.0    Cardiac Enzymes: No results for input(s): CKTOTAL, CKMB, CKMBINDEX, TROPONINI in the last 168 hours.  BNP (last 3 results) No results for input(s): PROBNP in the last 8760 hours.  Lipid Profile: No results for input(s): CHOL, HDL, LDLCALC, TRIG, CHOLHDL, LDLDIRECT in the last 72 hours.  Thyroid Function Tests: No results for input(s): TSH, T4TOTAL, FREET4, T3FREE, THYROIDAB in the last 72 hours.  Anemia Panel: No results for input(s): VITAMINB12, FOLATE, FERRITIN, TIBC, IRON, RETICCTPCT in the last 72 hours.  Urine analysis:    Component Value Date/Time   COLORURINE YELLOW 07/19/2023 2139   APPEARANCEUR HAZY (A) 07/19/2023 2139   LABSPEC 1.011 07/19/2023 2139   LABSPEC 1.030 01/20/2007 1306   PHURINE 5.0 07/19/2023 2139   GLUCOSEU NEGATIVE 07/19/2023 2139   HGBUR SMALL (A) 07/19/2023 2139   BILIRUBINUR NEGATIVE 07/19/2023 2139   BILIRUBINUR Negative 01/20/2007 1306   KETONESUR NEGATIVE 07/19/2023 2139   PROTEINUR >=300 (A)  07/19/2023 2139   UROBILINOGEN 0.2 08/13/2008 1352   NITRITE NEGATIVE 07/19/2023 2139   LEUKOCYTESUR TRACE (A) 07/19/2023 2139   LEUKOCYTESUR Trace 01/20/2007 1306    Sepsis Labs: Lactic Acid, Venous No results found for: LATICACIDVEN  MICROBIOLOGY: No results found for this or any previous visit (from the past 240 hours).  RADIOLOGY STUDIES/RESULTS: No results found.    LOS: 2 days   Kimberly Penna, MD  [facility]    To contact the attending provider between 7A-7P or the covering provider during after hours 7P-7A, please log into the web site [email or web address] and access using universal [place] password for that web site. If you do not have the password, please call the hospital operator.  07/22/2023, 8:43 AM

## 2023-07-22 NOTE — Progress Notes (Signed)
 Spoke with IR. Awaiting lab results/clearance to proceed with biopsy. Had recently spoken with Amber, phlebotomy who assured me that they were on their way to [facility] for lab draws.

## 2023-07-22 NOTE — Plan of Care (Signed)
   Problem: Education: Goal: Knowledge of General Education information will improve Description: Including pain rating scale, medication(s)/side effects and non-pharmacologic comfort measures Outcome: Progressing   Problem: Nutrition: Goal: Adequate nutrition will be maintained Outcome: Progressing   Problem: Coping: Goal: Level of anxiety will decrease Outcome: Progressing   Problem: Elimination: Goal: Will not experience complications related to bowel motility Outcome: Progressing Goal: Will not experience complications related to urinary retention Outcome: Progressing   Problem: Pain Managment: Goal: General experience of comfort will improve and/or be controlled Outcome: Progressing   Problem: Safety: Goal: Ability to remain free from injury will improve Outcome: Progressing

## 2023-07-22 NOTE — Progress Notes (Signed)
 [facility] Progress Note  Name: Ariel Johns MRN: [id number] DOB: Mar 22, 1985  Chief Complaint:  Directed to ER per nephrology for vision changes/worsening labs   Subjective:  Strict ins/outs are not available.  She had 625 mL UOP over 6/11.  She still consents to the renal biopsy and will be getting this renal biopsy later today with IR.  Her husband is at bedside.     Review of systems:  Denies shortness of breath Denies n/v Denies chest pain   ------------------------------ Background on consult:  Ariel Johns is a 38 y.o. female with a history of lupus with lupus nephritis, CKD, and hypertension who presented to the hospital at the direction of her nephrologist's office, Dr. Irene Mannheim.  She has seen Dr. Irene Mannheim for years - since 2006 or so.  Last renal biopsy was in 2015.  Note that she was previously admitted in April for AKI felt pre-renal in the setting of GI illness.  She was given fluids at that time and ARB and was held and diuretics were transitioned from scheduled to PRN at that time.  Nephrology advised presenting to the ER after her May appointment however she reports that she was unable to come until now.  Daughter had an appointment that she wanted to contact she states that she also had to work.  She reports that she called the office reporting headaches and blurry vision and was urged to come in so she did.  I do not have access to her outpatient EMR and have requested that notes be faxed to me.  Her mother joined us  via speaker phone.  The patient and I have discussed the risks, benefits, and indications for a renal biopsy and she does consent to a biopsy (when her BP is better controlled).  Her mother states that Dr. Irene Mannheim had a recent renal biopsy planned on 5/21.  She states this was canceled due to hypertension.  Most recently, she states that she has been on dilt, hydralazine  (which she reports is 50 mg BID), and lasix  80 mg BID.  She  states that she has been on prednisone  which she reports is at 60 mg daily currently which was added/increased in late May.  Note also myfortic  and hydroxychloroquine .    Later reviewed outpatient EMR and obtained additional history 6/10 PM.  She has had a history of prior struggles with compliance.  Per charting, the patient has depression and has previously declined Benlysta due to the same.  She follows with rheumatology Dr. Ebbie Goldmann however has not seen him as frequently as we have advised.  She had a biopsy in 2009 and was treated with Cytoxan  in 2009; she was then maintained on prednisone  and CellCept.  She then had repeat Cytoxan  (low dose) in 01/2014.  She developed recurrence of nephrotic syndrome in 2017 in the setting of being off of her medication for at least 2 weeks.  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  Note that as of 06/09/2023 her prednisone  was at 10 mg daily.  She had been advised to present to the ER after her 07/08/23 office visit and on 07/09/2023, her prednisone  was increased to 60 mg daily.  Per charting, the patient is not on hormonal birth control and is not desiring future pregnancies.  She has been counseled on the teratogenic effects of her medications and understands the risks.  Pharmacy was not able to locate a recent prescription for hydralazine  (after  01/2023).  Question if she meant hydroxychloroquine .    Lab trends (outpatient):   07/08/23 - Cr 2.33, BUN 56, eGFR 27, K 4.3; up/cr ratio 5811 mg/g, C4 18, C3 82; anti-DSDNA elevated Hb 11.0, alb 3.2   06/09/23 - Cr 2.34, BUN 73; up/cr ratio 2684 mg/g   02/2023 - Cr 1.58, eGFR 33; up/cr ratio 3682 mg/g   11/2022 - Cr 1.35, up/cr ratio 1181 mg/g    Intake/Output Summary (Last 24 hours) at 07/22/2023 0851 Last data filed at 07/22/2023 0600 Gross per 24 hour  Intake 1569.31 ml  Output 625 ml  Net 944.31 ml    Vitals:  Vitals:   07/21/23 2353  07/22/23 0436 07/22/23 0500 07/22/23 0718  BP: 138/87 137/82  (!) 146/80  Pulse: (!) 57 (!) 54  63  Resp: 17 16  18   Temp: 98 F (36.7 C) 97.7 F (36.5 C)  97.7 F (36.5 C)  TempSrc: Oral Oral  Oral  SpO2: 95% 97%  96%  Weight:   107.3 kg   Height:         Physical Exam:   General adult female in bed in no acute distress HEENT normocephalic atraumatic extraocular movements intact sclera anicteric Neck supple trachea midline Lungs clear to auscultation bilaterally normal work of breathing at rest  Heart S1S2 no rub Abdomen soft nontender nondistended Extremities no to trace edema lower extremities  Psych normal mood and affect Neuro alert and oriented x 3 provides hx and follows commands  Skin raynaud's - hands   Medications reviewed   Labs:     Latest Ref Rng & Units 07/21/2023    8:25 AM 07/20/2023    4:21 AM 07/19/2023    7:35 PM  BMP  Glucose 70 - 99 mg/dL 191  97  478   BUN 6 - 20 mg/dL 295  621  308   Creatinine 0.44 - 1.00 mg/dL 6.57  8.46  9.62   Sodium 135 - 145 mmol/L 135  135  136   Potassium 3.5 - 5.1 mmol/L 4.0  3.9  4.6   Chloride 98 - 111 mmol/L 106  107  104   CO2 22 - 32 mmol/L 20  19  19    Calcium  8.9 - 10.3 mg/dL 7.9  7.9  9.4      Assessment/Plan:    # HTN emergency  - We have started clonidine  0.1 mg BID here  - I had resumed dilt earlier and primary team has ordered amlodipine.  Will consolidate to amlodipine for now given her heart rate.  The calcium  channel blocker will also help with Raynaud's.  Would continue amlodipine on discharge - Increase hydralazine  back to 50 mg TID - Set prednisone  at 40 mg daily for now (reduction from pred 60 mg daily at home)  - hold lasix  for now - Start pantoprazole for GI ppx - start vit D   # AKI - Lupus nephritis as well as pre-renal insults.  - some improvement after admission here  - hold lasix   - Stop normal saline  - As below, she would benefit from an inpatient renal biopsy.   - Will also give  DDAVP today 32 mcg once  - she is NPO  - I have consulted IR for renal biopsy on 6/12   # Lupus nephritis  - She would benefit from an inpatient renal biopsy. BP is improved  - on myfortic  and plaquenil   - on prednisone  - I have decreased dose slightly to 40  mg daily - complement ok and anti-dsDNA elevated    # CKD stage 3b  - baseline Cr 1.35 - 1.6 from 11/2022 to 02/2023 with recent AKI event  - follows with Dr. Irene Mannheim with [facility]    Disposition - continue inpatient monitoring   Nan Aver, MD 07/22/2023 9:27 AM

## 2023-07-22 NOTE — Consult Note (Signed)
 Chief Complaint: Patient was seen in consultation today for acute kidney injury  Referring Physician(s): Dr. Shaune Delaine  Supervising Physician: Dr. Susan Ensign  Patient Status: [facility] - In-pt  History of Present Illness: Ariel Johns is a 38 y.o. female with GERD, HTN, and lupus nephritis, CKD who presented to [facility] ED with vision changes, low urine output.  Patient extensive history of complicated lupus nephritis with worsening renal function.  She has had several renal biopsies over the years, most recently in 2017.  Repeat biopsy requested by Dr. Yvonnie Heritage.    Patient assessed at bedside.  She is accompanied by her husband.  She is familiar with biopsy and process given she has undergone several of these in the past.  She is agreeable to proceed.  She has been NPO.    Past Medical History:  Diagnosis Date   GERD (gastroesophageal reflux disease)    Hypertension    Lupus nephritis (HCC) 2009   diagnosed at age 44   Raynaud disease     Past Surgical History:  Procedure Laterality Date   lymphnode biopsy     RENAL BIOPSY     TONSILLECTOMY AND ADENOIDECTOMY     age 89   WISDOM TOOTH EXTRACTION      Allergies: Codeine, Vancomycin , and Amoxicillin  Medications: Prior to Admission medications   Medication Sig Start Date End Date Taking? Authorizing Provider  DILT-XR 120 MG 24 hr capsule Take 120 mg by mouth at bedtime. 07/09/23  Yes [provider]  furosemide  (LASIX ) 40 MG tablet Take 40 mg by mouth 2 (two) times daily. 03/21/16  Yes [provider]  hydroxychloroquine  (PLAQUENIL ) 200 MG tablet Take 200 mg by mouth 2 (two) times daily.    Yes [provider]  mycophenolate  (MYFORTIC ) 180 MG EC tablet Take 180 mg by mouth 2 (two) times daily.   Yes [provider]  predniSONE  (DELTASONE ) 20 MG tablet Take 10-20 mg by mouth See admin instructions. Take 2 tablets by mouth in the morning and 1 tablet in the at bedtime   Yes [provider]  ranitidine (ZANTAC) 150 MG tablet Take 150 mg by mouth daily.   Yes [provider]     History reviewed. No pertinent family history.  Social History   Socioeconomic History   Marital status: Married    Spouse name: Not on file   Number of children: Not on file   Years of education: Not on file   Highest education level: Not on file  Occupational History   Not on file  Tobacco Use   Smoking status: Former    Current packs/day: 0.00    Types: Cigarettes    Start date: 09/08/2007    Quit date: 09/07/2013    Years since quitting: 9.8   Smokeless tobacco: Not on file  Substance and Sexual Activity   Alcohol use: Yes    Alcohol/week: 1.0 standard drink of alcohol    Types: 1 drink(s) per week   Drug use: No   Sexual activity: Not on file  Other Topics Concern   Not on file  Social History Narrative   Not on file   Social Drivers of Health   Financial Resource Strain: Not on file  Food Insecurity: No Food Insecurity (07/20/2023)   Hunger Vital Sign    Worried About Running Out of Food in the Last Year: Never true    Ran Out of Food in the Last Year: Never true  Transportation Needs:  No Transportation Needs (07/20/2023)   PRAPARE - Administrator, Civil Service (Medical): No    Lack of Transportation (Non-Medical): No  Physical Activity: Not on file  Stress: Not on file  Social Connections: Not on file     Review of Systems: A 12 point ROS discussed and pertinent positives are indicated in the HPI above.  All other systems are negative.  Review of Systems  Constitutional:  Negative for fatigue and fever.  Respiratory:  Negative for cough and shortness of breath.   Cardiovascular:  Negative for chest pain.  Gastrointestinal:  Negative for abdominal pain, nausea and vomiting.  Psychiatric/Behavioral:  Negative for behavioral problems and confusion.     Vital Signs: BP (!) 146/80 (BP Location: Left Arm)   Pulse 63   Temp 97.7 F  (36.5 C) (Oral)   Resp 18   Ht 5' 6 (1.676 m)   Wt 236 lb 8.9 oz (107.3 kg)   LMP 06/21/2023 (Exact Date)   SpO2 96%   BMI 38.18 kg/m   Physical Exam Vitals and nursing note reviewed.  Constitutional:      General: She is not in acute distress.    Appearance: Normal appearance. She is not ill-appearing.  HENT:     Mouth/Throat:     Mouth: Mucous membranes are moist.     Pharynx: Oropharynx is clear.   Cardiovascular:     Rate and Rhythm: Normal rate and regular rhythm.  Pulmonary:     Effort: Pulmonary effort is normal.     Breath sounds: Normal breath sounds.  Abdominal:     General: Abdomen is flat. There is no distension.   Skin:    General: Skin is warm and dry.   Neurological:     General: No focal deficit present.     Mental Status: She is alert and oriented to person, place, and time. Mental status is at baseline.   Psychiatric:        Mood and Affect: Mood normal.        Behavior: Behavior normal.        Thought Content: Thought content normal.        Judgment: Judgment normal.      MD Evaluation Airway: WNL Heart: WNL Abdomen: WNL Chest/ Lungs: WNL ASA  Classification: 3 Mallampati/Airway Score: Two   Imaging: CT HEAD WO CONTRAST ( ) Result Date: 07/20/2023 CLINICAL DATA:  Sudden severe headache and hypertension EXAM: CT HEAD WITHOUT CONTRAST TECHNIQUE: Contiguous axial images were obtained from the base of the skull through the vertex without intravenous contrast. RADIATION DOSE REDUCTION: This exam was performed according to the departmental dose-optimization program which includes automated exposure control, adjustment of the mA and/or kV according to patient size and/or use of iterative reconstruction technique. COMPARISON:  CT head 01/23/2009 FINDINGS: Brain: No intracranial hemorrhage, mass effect, or evidence of acute infarct. No hydrocephalus. No extra-axial fluid collection. Chronic left temporoparietal encephalomalacia. Vascular: No  hyperdense vessel or unexpected calcification. Skull: No fracture or focal lesion. Sinuses/Orbits: No acute finding. Other: None. IMPRESSION: No acute intracranial abnormality. Chronic left temporoparietal encephalomalacia. Electronically Signed   By: Rozell Cornet M.D.   On: 07/20/2023 03:08   US  Renal Result Date: 07/19/2023 CLINICAL DATA:  Renal failure EXAM: RENAL / URINARY TRACT ULTRASOUND COMPLETE COMPARISON:  None Available. FINDINGS: Right Kidney: Renal measurements: 11.4 x 5.7 x 4.7 cm = volume: 159 mL. Echogenic cortex. Multiple, fewer than 10 cysts. The largest is seen at the lower pole and  measures 2.9 x 2.7 x 3.1 cm. No imaging follow-up is recommended Left Kidney: Renal measurements: 10.2 x 5.7 x 4.8 cm = volume: 146.7 mL. Echogenic cortex. No hydronephrosis or mass Bladder: Appears normal for degree of bladder distention. Other: None. IMPRESSION: Echogenic renal cortices consistent with medical renal disease. No hydronephrosis. Electronically Signed   By: Esmeralda Hedge M.D.   On: 07/19/2023 22:48   DG Chest 2 View Result Date: 07/19/2023 CLINICAL DATA:  HTN , bilateral lower extremity swelling EXAM: CHEST - 2 VIEW COMPARISON:  January 28, 2023 FINDINGS: No focal airspace consolidation, pleural effusion, or pneumothorax. No cardiomegaly. No acute fracture or destructive lesion. Multilevel thoracic osteophytosis. IMPRESSION: No acute cardiopulmonary abnormality. Electronically Signed   By: Rance Burrows M.D.   On: 07/19/2023 20:02    Labs:  CBC: Recent Labs    05/24/23 0427 06/30/23 0642 07/19/23 1935 07/21/23 0825  WBC 5.0 4.4 9.0 9.4  HGB 10.6* 10.6* 12.1 11.1*  HCT 31.4* 31.4* 38.3 34.2*  PLT 216 206 281 232    COAGS: Recent Labs    06/30/23 0642  INR 1.0    BMP: Recent Labs    05/24/23 0427 07/19/23 1935 07/20/23 0421 07/21/23 0825  NA 139 136 135 135  K 3.0* 4.6 3.9 4.0  CL 96* 104 107 106  CO2 31 19* 19* 20*  GLUCOSE 117* 121* 97 101*  BUN 62* 126*  114* 114*  CALCIUM  6.8* 9.4 7.9* 7.9*  CREATININE 3.32* 3.43* 2.91* 3.07*  GFRNONAA 17* 17* 20* 19*    LIVER FUNCTION TESTS: Recent Labs    05/22/23 0802 05/24/23 0427 07/19/23 1935  BILITOT 0.3 0.3 0.7  AST 15 17 19   ALT 12 12 22   ALKPHOS 55 37* 38  PROT 6.8 4.7* 5.2*  ALBUMIN 3.6 2.4* 3.0*    TUMOR MARKERS: No results for input(s): AFPTM, CEA, CA199, CHROMGRNA in the last 8760 hours.  Assessment and Plan: Acute kidney injury Patient with history of lupus nephritis in need of repeat renal biopsy.  BP 146/80 this AM.   Discussed with Nephrology who has ordered a dose of DDAVP.  She is NPO.  INR 1.0  Risks and benefits of biopsy was discussed with the patient and/or patient's family including, but not limited to bleeding, infection, damage to adjacent structures or low yield requiring additional tests.  All of the questions were answered and there is agreement to proceed.  Consent signed and in chart.  Thank you for this interesting consult.  I greatly enjoyed meeting Ariel Johns and look forward to participating in their care.  A copy of this report was sent to the requesting provider on this date.  Electronically Signed: Miosotis Wetsel Sue-Ellen Elveria Lauderbaugh, PA 07/22/2023, 8:18 AM   I spent a total of 40 Minutes    in face to face in clinical consultation, greater than 50% of which was counseling/coordinating care for acute kidney injury.

## 2023-07-22 NOTE — Procedures (Signed)
 Interventional Radiology Procedure Note  Procedure: Renal biopsy bland US  Guided Biopsy of left kidney  Complications: None  Estimated Blood Loss: < 10 mL  Findings: 3 18Ga CNB left kidney.  Rosetta Cons, MD

## 2023-07-23 DIAGNOSIS — N179 Acute kidney failure, unspecified: Secondary | ICD-10-CM | POA: Diagnosis not present

## 2023-07-23 LAB — IRON AND TIBC
Iron: 84 ug/dL (ref 28–170)
Saturation Ratios: 34 % — ABNORMAL HIGH (ref 10.4–31.8)
TIBC: 245 ug/dL — ABNORMAL LOW (ref 250–450)
UIBC: 161 ug/dL

## 2023-07-23 LAB — CBC
HCT: 29.9 % — ABNORMAL LOW (ref 36.0–46.0)
Hemoglobin: 9.6 g/dL — ABNORMAL LOW (ref 12.0–15.0)
MCH: 27.1 pg (ref 26.0–34.0)
MCHC: 32.1 g/dL (ref 30.0–36.0)
MCV: 84.5 fL (ref 80.0–100.0)
Platelets: 191 10*3/uL (ref 150–400)
RBC: 3.54 MIL/uL — ABNORMAL LOW (ref 3.87–5.11)
RDW: 14.9 % (ref 11.5–15.5)
WBC: 10.8 10*3/uL — ABNORMAL HIGH (ref 4.0–10.5)
nRBC: 0 % (ref 0.0–0.2)

## 2023-07-23 LAB — VITAMIN D 25 HYDROXY (VIT D DEFICIENCY, FRACTURES): Vit D, 25-Hydroxy: 11.49 ng/mL — ABNORMAL LOW (ref 30–100)

## 2023-07-23 LAB — PHOSPHORUS: Phosphorus: 4.1 mg/dL (ref 2.5–4.6)

## 2023-07-23 LAB — MAGNESIUM: Magnesium: 2.8 mg/dL — ABNORMAL HIGH (ref 1.7–2.4)

## 2023-07-23 LAB — BASIC METABOLIC PANEL WITH GFR
Anion gap: 10 (ref 5–15)
BUN: 102 mg/dL — ABNORMAL HIGH (ref 6–20)
CO2: 18 mmol/L — ABNORMAL LOW (ref 22–32)
Calcium: 8 mg/dL — ABNORMAL LOW (ref 8.9–10.3)
Chloride: 108 mmol/L (ref 98–111)
Creatinine, Ser: 2.86 mg/dL — ABNORMAL HIGH (ref 0.44–1.00)
GFR, Estimated: 21 mL/min — ABNORMAL LOW (ref >60)
Glucose, Bld: 156 mg/dL — ABNORMAL HIGH (ref 70–99)
Potassium: 4.3 mmol/L (ref 3.5–5.1)
Sodium: 136 mmol/L (ref 135–145)

## 2023-07-23 LAB — FOLATE: Folate: 8.9 ng/mL (ref >5.9)

## 2023-07-23 LAB — VITAMIN B12: Vitamin B-12: 724 pg/mL (ref 180–914)

## 2023-07-23 MED ORDER — MYCOPHENOLATE SODIUM 180 MG PO TBEC
360.0000 mg | DELAYED_RELEASE_TABLET | Freq: Two times a day (BID) | ORAL | Status: DC
  Administered 2023-07-24 (×2): 360 mg via ORAL
  Filled 2023-07-23 (×5): qty 2

## 2023-07-23 MED ORDER — SODIUM BICARBONATE 650 MG PO TABS
650.0000 mg | ORAL_TABLET | Freq: Three times a day (TID) | ORAL | Status: DC
  Administered 2023-07-23 – 2023-07-24 (×5): 650 mg via ORAL
  Filled 2023-07-23 (×6): qty 1

## 2023-07-23 MED ORDER — VITAMIN D (ERGOCALCIFEROL) 1.25 MG (50000 UNIT) PO CAPS
50000.0000 [IU] | ORAL_CAPSULE | ORAL | Status: DC
  Administered 2023-07-24: 50000 [IU] via ORAL
  Filled 2023-07-23: qty 1

## 2023-07-23 NOTE — Progress Notes (Signed)
 [facility] Progress Note  Name: Ariel Johns MRN: [id number] DOB: [birth year]  Chief Complaint:  Directed to ER per nephrology for vision changes/worsening labs   Subjective:  She had 1.5 liters UOP over 6/12.  Got renal biopsy on 6/12 with IR.  She feels like this biopsy was the easiest one on her of the three biopsies she has had.   Spoke with her husband at bedside.  Feels ok today.  She has been on lasix  for years.  Used to be 40 mg daily PRN.  I let her know to resume this dose  Review of systems:  Denies shortness of breath Denies n/v Denies chest pain   ------------------------------ Background on consult:  Ariel Johns is a 38 y.o. female with a history of lupus with lupus nephritis, CKD, and hypertension who presented to the hospital at the direction of her nephrologist's office, Dr. Irene Mannheim.  She has seen Dr. Irene Mannheim for years - since 2006 or so.  Last renal biopsy was in 2015.  Note that she was previously admitted in April for AKI felt pre-renal in the setting of GI illness.  She was given fluids at that time and ARB and was held and diuretics were transitioned from scheduled to PRN at that time.  Nephrology advised presenting to the ER after her May appointment however she reports that she was unable to come until now.  Daughter had an appointment that she wanted to contact she states that she also had to work.  She reports that she called the office reporting headaches and blurry vision and was urged to come in so she did.  I do not have access to her outpatient EMR and have requested that notes be faxed to me.  Her mother joined us  via speaker phone.  The patient and I have discussed the risks, benefits, and indications for a renal biopsy and she does consent to a biopsy (when her BP is better controlled).  Her mother states that Dr. Irene Mannheim had a recent renal biopsy planned on 5/21.  She states this was canceled due to hypertension.  Most  recently, she states that she has been on dilt, hydralazine  (which she reports is 50 mg BID), and lasix  80 mg BID.  She states that she has been on prednisone  which she reports is at 60 mg daily currently which was added/increased in late May.  Note also myfortic  and hydroxychloroquine .    Later reviewed outpatient EMR and obtained additional history 6/10 PM.  She has had a history of prior struggles with compliance.  Per charting, the patient has depression and has previously declined Benlysta due to the same.  She follows with rheumatology Dr. Ebbie Goldmann however has not seen him as frequently as we have advised.  She had a biopsy in 2009 and was treated with Cytoxan  in 2009; she was then maintained on prednisone  and CellCept.  She then had repeat Cytoxan  (low dose) in 01/2014.  She developed recurrence of nephrotic syndrome in 2017 in the setting of being off of her medication for at least 2 weeks.  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  Note that as of 06/09/2023 her prednisone  was at 10 mg daily.  She had been advised to present to the ER after her 07/08/23 office visit and on 07/09/2023, her prednisone  was increased to 60 mg daily.  Per charting, the patient is not on hormonal birth control and is not desiring future  pregnancies.  She has been counseled on the teratogenic effects of her medications and understands the risks.  Pharmacy was not able to locate a recent prescription for hydralazine  (after 01/2023).  Question if she meant hydroxychloroquine .    Lab trends (outpatient):   07/08/23 - Cr 2.33, BUN 56, eGFR 27, K 4.3; up/cr ratio 5811 mg/g, C4 18, C3 82; anti-DSDNA elevated Hb 11.0, alb 3.2   06/09/23 - Cr 2.34, BUN 73; up/cr ratio 2684 mg/g   02/2023 - Cr 1.58, eGFR 33; up/cr ratio 3682 mg/g   11/2022 - Cr 1.35, up/cr ratio 1181 mg/g    Intake/Output Summary (Last 24 hours) at 07/23/2023 1301 Last data filed at 07/23/2023  0600 Gross per 24 hour  Intake --  Output 1450 ml  Net -1450 ml    Vitals:  Vitals:   07/22/23 1950 07/22/23 2145 07/23/23 0808 07/23/23 0858  BP: 121/68 129/85 (!) 149/91 (!) 142/97  Pulse: 70  (!) 59   Resp: 13  17   Temp:   97.8 F (36.6 C)   TempSrc:   Oral   SpO2: 100%  100%   Weight:      Height:         Physical Exam:   General adult female in bed in no acute distress HEENT normocephalic atraumatic extraocular movements intact sclera anicteric Neck supple trachea midline Lungs clear to auscultation bilaterally normal work of breathing at rest on room air  Heart S1S2 no rub Abdomen soft nontender nondistended Extremities no edema lower extremities  Psych normal mood and affect Neuro alert and oriented x 3 provides hx and follows commands  Skin raynaud's - hands   Medications reviewed   Labs:     Latest Ref Rng & Units 07/23/2023   10:49 AM 07/22/2023    7:47 AM 07/21/2023    8:25 AM  BMP  Glucose 70 - 99 mg/dL 308  81  657   BUN 6 - 20 mg/dL 846  962  952   Creatinine 0.44 - 1.00 mg/dL 8.41  3.24  4.01   Sodium 135 - 145 mmol/L 136  139  135   Potassium 3.5 - 5.1 mmol/L 4.3  4.4  4.0   Chloride 98 - 111 mmol/L 108  111  106   CO2 22 - 32 mmol/L 18  19  20    Calcium  8.9 - 10.3 mg/dL 8.0  8.0  7.9      Assessment/Plan:    # HTN emergency - improved - We have started clonidine  0.1 mg BID here - would continue on discharge - Previously on dilt.  We transitioned to amlodipine  for now given her heart rate.  The calcium  channel blocker will also help with Raynaud's.  Would continue amlodipine  on discharge - Increased hydralazine  back to 50 mg TID - Set prednisone  at 40 mg daily for now (reduction from pred 60 mg daily at home)  - on discharge can resume lasix  40 mg daily PRN swelling - continue pantoprazole  for GI ppx - continue vit D 2000 units daily    # AKI - Lupus nephritis as well as pre-renal insults.  - some improvement after admission here  -  hold lasix   - s/p renal biopsy on 6/12 with IR. Got DDAVP    # Lupus nephritis  - s/p renal biopsy on 6/13 - on myfortic  and plaquenil   - on prednisone  - I have decreased dose slightly to 40 mg daily.  Continue same for now - complement ok and  anti-dsDNA elevated  - not a candidate for benlysta with depression per charting.  Not sure if saphnelo would be a long-term option.  Note has previously gotten cytoxan     # CKD stage 3b  - baseline Cr 1.35 - 1.6 from 11/2022 to 02/2023 with recent AKI event  - follows with Dr. Irene Mannheim with [facility]    Disposition - She is acceptable for discharge today from a strictly renal standpoint.  I have requested follow-up with Dr. Irene Mannheim with our office or extender within 1-2 weeks.  The patient tells me that she has follow-up on next Wednesday with an extender.  She is also going to reach out to Dr. Ebbie Goldmann to set up rheum follow-up   Nan Aver, MD 07/23/2023 1:44 PM

## 2023-07-23 NOTE — Plan of Care (Signed)
 Patient BP has been stable with highest SBP of 150's. Patient received Magnesium  IV replacement last night for level of 1.6.

## 2023-07-23 NOTE — Progress Notes (Signed)
 PROGRESS NOTE        PATIENT DETAILS Name: Ariel Johns Age: 38 y.o. Sex: female Date of Birth: 1985-05-03 Admit Date: 07/19/2023 Admitting Physician Modena Andes, MD [id number], No Pcp Per  Brief Summary: Patient is a 38 y.o.  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  Significant events: 6/10>> admit to [facility]  Significant studies: 6/9>> CXR: No pneumonia 6/9>> renal ultrasound: No hydronephrosis 6/9>> UA:> 300 protein. 6/9>> urine protein/creatinine ratio: 4.04. 6/9>>ANA: +ve 6/9>> anti-dsDNA: 150 (elevated) 6/9>> C3/C4: normal limits 6/10>> CT head: No acute intracranial abnormality  Significant microbiology data: None  Procedures: 6/12>> s/p left renal biopsy by IR    Consults: Nephrology  Subjective: Patient was seen and examined at bedside during morning rounds.  No significant overnight events.  Patient was complaining of dyspnea on exertion, feeling short of breath while walking around.  Denied any other complaints.   Objective: Vitals: Blood pressure 126/76, pulse 68, temperature 97.8 F (36.6 C), temperature source Oral, resp. rate (!) 21, height 5' 6 (1.676 m), weight 107.3 kg, last menstrual period 06/21/2023, SpO2 98%.   Exam: Awake/alert Chest: Clear to auscultation CVS: S1-S2 regular Abdomen: Soft nontender nondistended Extremities: Trace edema.  Pertinent Labs/Radiology:    Latest Ref Rng & Units 07/23/2023   10:49 AM 07/22/2023    7:47 AM 07/21/2023    8:25 AM  CBC  WBC 4.0 - 10.5 K/uL 10.8  8.8  9.4   Hemoglobin 12.0 - 15.0 g/dL 9.6  87.5  64.3   Hematocrit 36.0 - 46.0 % 29.9  33.2  34.2   Platelets 150 - 400 K/uL 191  215  232     Lab Results  Component Value Date   NA 136 07/23/2023   K 4.3 07/23/2023   CL  108 07/23/2023   CO2 18 (L) 07/23/2023      Assessment/Plan: Hypertensive emergency BP control much better-likely provoked by worsening renal function  HTN BP significantly better controlled this morning Continue current dosing of amlodipine /clonidine /hydralazine   AKI on CKD stage IIIa AKI felt to be multifactorial-possible uncontrolled hypertension-underlying lupus nephritis Creatinine slowly improving with better blood pressure control Lupus serology with elevated dsDNA-but normal complements 6/12 S/p Left Renal biopsy done by IR Nephrology cleared to discharge 6/13 CO2 18, metabolic acidosis might be causing dyspnea on exertion.  Started sodium bicarbonate  650 mg p.o. 3 times daily. Repeat a.m. labs tomorrow a.m. and then DC home if remains stable.  History of lupus nephritis Inpatient renal biopsy planned for 6/12 Remains on Plaquenil /prednisone /Myfortic   Normocytic anemia Mild Likely secondary to acute illness/CKD Trend CBC periodically Iron, folate and B12 level, pending report  Hypocalcemia, mild, check vitamin D  level   Class 2 Obesity: Estimated body mass index is 38.18 kg/m as calculated from the following:   Height as of this encounter: 5' 6 (1.676 m).   Weight as of this encounter: 107.3 kg.   Code status:   Code Status: Full Code   DVT Prophylaxis: heparin  injection 5,000 Units Start: 07/21/23 1400 SCDs Start: 07/20/23 0227   Family Communication:  Spouse at bedside.   Disposition Plan: Status is: Inpatient Remains inpatient appropriate because: Severity of illness   Planned Discharge  Destination:Home most likely tomorrow a.m. if remains stable   Diet: Diet Order             Diet Heart Room service appropriate? Yes; Fluid consistency: Thin  Diet effective now                     Antimicrobial agents: Anti-infectives (From admission, onward)    Start     Dose/Rate Route Frequency Ordered Stop   07/20/23 0400  hydroxychloroquine   (PLAQUENIL ) tablet 200 mg        200 mg Oral 2 times daily 07/20/23 0225          MEDICATIONS: Scheduled Meds:  amLODipine   10 mg Oral Daily   cholecalciferol   2,000 Units Oral Daily   cloNIDine   0.1 mg Oral BID   heparin  injection (subcutaneous)  5,000 Units Subcutaneous Q8H   hydrALAZINE   50 mg Oral TID   hydroxychloroquine   200 mg Oral BID   mycophenolate   180 mg Oral BID   pantoprazole   40 mg Oral Daily   predniSONE   40 mg Oral Q breakfast   sodium bicarbonate   650 mg Oral TID   Continuous Infusions:   PRN Meds:.acetaminophen  **OR** acetaminophen , hydrALAZINE    I have personally reviewed following labs and imaging studies  LABORATORY DATA: CBC: Recent Labs  Lab 07/19/23 1935 07/21/23 0825 07/22/23 0747 07/23/23 1049  WBC 9.0 9.4 8.8 10.8*  HGB 12.1 11.1* 10.5* 9.6*  HCT 38.3 34.2* 33.2* 29.9*  MCV 83.6 83.0 84.7 84.5  PLT 281 232 215 191    Basic Metabolic Panel: Recent Labs  Lab 07/19/23 1935 07/20/23 0421 07/21/23 0825 07/22/23 0747 07/23/23 1049  NA 136 135 135 139 136  K 4.6 3.9 4.0 4.4 4.3  CL 104 107 106 111 108  CO2 19* 19* 20* 19* 18*  GLUCOSE 121* 97 101* 81 156*  BUN 126* 114* 114* 111* 102*  CREATININE 3.43* 2.91* 3.07* 2.85* 2.86*  CALCIUM  9.4 7.9* 7.9* 8.0* 8.0*  MG  --   --   --   --  2.8*  PHOS  --   --   --  5.1* 4.1    GFR: Estimated Creatinine Clearance: 32.7 mL/min (A) (by C-G formula based on SCr of 2.86 mg/dL (H)).  Liver Function Tests: Recent Labs  Lab 07/19/23 1935 07/22/23 0747  AST 19  --   ALT 22  --   ALKPHOS 38  --   BILITOT 0.7  --   PROT 5.2*  --   ALBUMIN 3.0* 2.3*   No results for input(s): LIPASE, AMYLASE in the last 168 hours. No results for input(s): AMMONIA in the last 168 hours.  Coagulation Profile: Recent Labs  Lab 07/22/23 0747  INR 1.0    Cardiac Enzymes: No results for input(s): CKTOTAL, CKMB, CKMBINDEX, TROPONINI in the last 168 hours.  BNP (last 3 results) No  results for input(s): PROBNP in the last 8760 hours.  Lipid Profile: No results for input(s): CHOL, HDL, LDLCALC, TRIG, CHOLHDL, LDLDIRECT in the last 72 hours.  Thyroid Function Tests: No results for input(s): TSH, T4TOTAL, FREET4, T3FREE, THYROIDAB in the last 72 hours.  Anemia Panel: No results for input(s): VITAMINB12, FOLATE, FERRITIN, TIBC, IRON, RETICCTPCT in the last 72 hours.  Urine analysis:    Component Value Date/Time   COLORURINE YELLOW 07/19/2023 2139   APPEARANCEUR HAZY (A) 07/19/2023 2139   LABSPEC 1.011 07/19/2023 2139   LABSPEC 1.030 01/20/2007 1306   PHURINE 5.0 07/19/2023 2139  GLUCOSEU NEGATIVE 07/19/2023 2139   HGBUR SMALL (A) 07/19/2023 2139   BILIRUBINUR NEGATIVE 07/19/2023 2139   BILIRUBINUR Negative 01/20/2007 1306   KETONESUR NEGATIVE 07/19/2023 2139   PROTEINUR >=300 (A) 07/19/2023 2139   UROBILINOGEN 0.2 08/13/2008 1352   NITRITE NEGATIVE 07/19/2023 2139   LEUKOCYTESUR TRACE (A) 07/19/2023 2139   LEUKOCYTESUR Trace 01/20/2007 1306    Sepsis Labs: Lactic Acid, Venous No results found for: LATICACIDVEN  MICROBIOLOGY: No results found for this or any previous visit (from the past 240 hours).  RADIOLOGY STUDIES/RESULTS: US  BIOPSY (KIDNEY) Result Date: 07/22/2023 INDICATION: Acute kidney injury EXAM: Ultrasound-guided renal biopsy MEDICATIONS: None. ANESTHESIA/SEDATION: Moderate (conscious) sedation was employed during this procedure. A total of Versed  2 mg and Fentanyl  50 mcg was administered intravenously by the radiology nurse. Total intra-service moderate Sedation Time: 34 minutes. The patient's level of consciousness and vital signs were monitored continuously by radiology nursing throughout the procedure under my direct supervision. COMPLICATIONS: None immediate. PROCEDURE: Informed written consent was obtained from the patient after a thorough discussion of the procedural risks, benefits and alternatives.  All questions were addressed. Maximal Sterile Barrier Technique was utilized including caps, mask, sterile gowns, sterile gloves, sterile drape, hand hygiene and skin antiseptic. A timeout was performed prior to the initiation of the procedure. With the patient in a prone position both kidneys were evaluated with ultrasound for optimal access. The left kidney was chosen for access in the skin was marked. The patient was then prepped and draped in usual sterile fashion. Local anesthesia was achieved with 1% lidocaine . Small incision was made the patient's left flank region and the access needle was advanced with ultrasound guidance by advancing the needle from the skin to the renal cortex under ultrasound guidance visualizing the needle to the level of the lower pole the left kidney. Once the needle was identified in this position, the BioPince biopsy needle was prepped and advanced through the cannula to the lower pole of the left kidney. A total of 3 biopsy samples were obtained. Post sampling Gelfoam administration was performed. No active hemorrhage identified at the end the procedure. IMPRESSION: Satisfactory core needle biopsy lower pole left kidney with ultrasound guidance. Electronically Signed   By: Susan Ensign   On: 07/22/2023 15:48      LOS: 3 days   Althia Atlas, MD  [facility]    To contact the attending provider between 7A-7P or the covering provider during after hours 7P-7A, please log into the web site [email or web address] and access using universal [place] password for that web site. If you do not have the password, please call the hospital operator.  07/23/2023, 2:35 PM

## 2023-07-23 NOTE — Plan of Care (Signed)
 Called by [employer] with prelim renal biopsy:   Focal class III lupus nephritis.  Activity is pretty mild - 2 of 24.  No crescents.  Moderate to severe chronicity.  60% interstitial fibrosis.  Chronicity score of 9/12.    For now I have continued pred and increased mycophenolate  to 360 mg BID.  Per team's charting they will reassess in AM.   Nan Aver, MD 6:27 PM 07/23/2023

## 2023-07-24 ENCOUNTER — Inpatient Hospital Stay (HOSPITAL_COMMUNITY)

## 2023-07-24 DIAGNOSIS — N179 Acute kidney failure, unspecified: Secondary | ICD-10-CM | POA: Diagnosis not present

## 2023-07-24 LAB — RENAL FUNCTION PANEL
Albumin: 2.3 g/dL — ABNORMAL LOW (ref 3.5–5.0)
Anion gap: 9 (ref 5–15)
BUN: 109 mg/dL — ABNORMAL HIGH (ref 6–20)
CO2: 18 mmol/L — ABNORMAL LOW (ref 22–32)
Calcium: 8.1 mg/dL — ABNORMAL LOW (ref 8.9–10.3)
Chloride: 108 mmol/L (ref 98–111)
Creatinine, Ser: 2.73 mg/dL — ABNORMAL HIGH (ref 0.44–1.00)
GFR, Estimated: 22 mL/min — ABNORMAL LOW (ref >60)
Glucose, Bld: 80 mg/dL (ref 70–99)
Phosphorus: 3.9 mg/dL (ref 2.5–4.6)
Potassium: 4.8 mmol/L (ref 3.5–5.1)
Sodium: 135 mmol/L (ref 135–145)

## 2023-07-24 LAB — CBC
HCT: 30.4 % — ABNORMAL LOW (ref 36.0–46.0)
Hemoglobin: 9.6 g/dL — ABNORMAL LOW (ref 12.0–15.0)
MCH: 26.9 pg (ref 26.0–34.0)
MCHC: 31.6 g/dL (ref 30.0–36.0)
MCV: 85.2 fL (ref 80.0–100.0)
Platelets: 201 10*3/uL (ref 150–400)
RBC: 3.57 MIL/uL — ABNORMAL LOW (ref 3.87–5.11)
RDW: 14.8 % (ref 11.5–15.5)
WBC: 10.4 10*3/uL (ref 4.0–10.5)
nRBC: 0 % (ref 0.0–0.2)

## 2023-07-24 MED ORDER — FUROSEMIDE 40 MG PO TABS
40.0000 mg | ORAL_TABLET | Freq: Once | ORAL | Status: AC
  Administered 2023-07-24: 40 mg via ORAL
  Filled 2023-07-24: qty 1

## 2023-07-24 MED ORDER — PREDNISONE 5 MG PO TABS
30.0000 mg | ORAL_TABLET | Freq: Every day | ORAL | Status: DC
  Filled 2023-07-24: qty 2

## 2023-07-24 MED ORDER — TECHNETIUM TO 99M ALBUMIN AGGREGATED
4.3200 | Freq: Once | INTRAVENOUS | Status: AC | PRN
  Administered 2023-07-24: 4.32 via INTRAVENOUS

## 2023-07-24 MED ORDER — HYDRALAZINE HCL 50 MG PO TABS
100.0000 mg | ORAL_TABLET | Freq: Three times a day (TID) | ORAL | Status: DC
  Administered 2023-07-24 (×2): 100 mg via ORAL
  Filled 2023-07-24 (×3): qty 2

## 2023-07-24 NOTE — Progress Notes (Signed)
 [facility] Progress Note  Name: Ariel Johns MRN: [id number] DOB: Jul 10, 1985  Chief Complaint:  Directed to ER per nephrology for vision changes/worsening labs   Subjective:  Ins/outs do not appear accurate.  7510 mL appears to have been charted as one entry perhaps instead of 750?  Yesterday, I was called by Ariel Johns with prelim renal biopsy:  Focal class III lupus nephritis.  Activity is pretty mild - 2 of 24.  No crescents.  Moderate to severe chronicity.  60% interstitial fibrosis.  Chronicity score of 9/12.  For now I have continued pred and increased mycophenolate  to 360 mg BID.  Spoke with her mother and husband.  When I initially came by she was off floor for a lung scan -team ruling out PE and we recommended starting with V/q scan given CKD.  Her mother states that clonidine  has made her pretty sleepy.  She has gotten back from the V/Q scan which was negative.  She states she's has nausea before with mycophenolate  - she thinks if she takes with food it may improve.       Review of systems:    Denies shortness of breath Denies n/v Some chest discomfort - right upper chest   ------------------------------ Background on consult:  Ariel Johns is a 38 y.o. female with a history of lupus with lupus nephritis, CKD, and hypertension who presented to the hospital at the direction of her nephrologist's office, Dr. Irene Johns.  She has seen Dr. Irene Johns for years - since 2006 or so.  Last renal biopsy was in 2015.  Note that she was previously admitted in April for AKI felt pre-renal in the setting of GI illness.  She was given fluids at that time and ARB and was held and diuretics were transitioned from scheduled to PRN at that time.  Nephrology advised presenting to the ER after her May appointment however she reports that she was unable to come until now.  Daughter had an appointment that she wanted to contact she states that she also had to work.  She  reports that she called the office reporting headaches and blurry vision and was urged to come in so she did.  I do not have access to her outpatient EMR and have requested that notes be faxed to me.  Her mother joined us  via speaker phone.  The patient and I have discussed the risks, benefits, and indications for a renal biopsy and she does consent to a biopsy (when her BP is better controlled).  Her mother states that Dr. Irene Johns had a recent renal biopsy planned on 5/21.  She states this was canceled due to hypertension.  Most recently, she states that she has been on dilt, hydralazine  (which she reports is 50 mg BID), and lasix  80 mg BID.  She states that she has been on prednisone  which she reports is at 60 mg daily currently which was added/increased in late May.  Note also myfortic  and hydroxychloroquine .    Later reviewed outpatient EMR and obtained additional history 6/10 PM.  She has had a history of prior struggles with compliance.  Per charting, the patient has depression and has previously declined Benlysta due to the same.  She follows with rheumatology Dr. Ebbie Johns however has not seen him as frequently as we have advised.  She had a biopsy in 2009 and was treated with Cytoxan  in 2009; she was then maintained on prednisone  and CellCept.  She then had repeat Cytoxan  (low dose) in  01/2014.  She developed recurrence of nephrotic syndrome in 2017 in the setting of being off of her medication for at least 2 weeks.  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  Note that as of 06/09/2023 her prednisone  was at 10 mg daily.  She had been advised to present to the ER after her 07/08/23 office visit and on 07/09/2023, her prednisone  was increased to 60 mg daily.  Per charting, the patient is not on hormonal birth control and is not desiring future pregnancies.  She has been counseled on the teratogenic effects of her medications and  understands the risks.  Pharmacy was not able to locate a recent prescription for hydralazine  (after 01/2023).  Question if she meant hydroxychloroquine .    Lab trends (outpatient):   07/08/23 - Cr 2.33, BUN 56, eGFR 27, K 4.3; up/cr ratio 5811 mg/g, C4 18, C3 82; anti-DSDNA elevated Hb 11.0, alb 3.2   06/09/23 - Cr 2.34, BUN 73; up/cr ratio 2684 mg/g   02/2023 - Cr 1.58, eGFR 33; up/cr ratio 3682 mg/g   11/2022 - Cr 1.35, up/cr ratio 1181 mg/g    Intake/Output Summary (Last 24 hours) at 07/24/2023 1116 Last data filed at 07/23/2023 1300 Gross per 24 hour  Intake --  Output 7510 ml  Net -7510 ml    Vitals:  Vitals:   07/24/23 0009 07/24/23 0400 07/24/23 0500 07/24/23 0836  BP: 131/83 (!) 143/87  (!) 151/91  Pulse:  60  63  Resp:  18  12  Temp: (!) 97 F (36.1 C) 97.9 F (36.6 C)    TempSrc:  Oral    SpO2:  99%  100%  Weight:   111.8 kg   Height:         Physical Exam:    General adult female in bed in no acute distress HEENT normocephalic atraumatic extraocular movements intact sclera anicteric Neck supple trachea midline Lungs clear to auscultation bilaterally normal work of breathing at rest on room air  Heart S1S2 no rub Abdomen soft nontender nondistended Extremities no edema lower extremities  Psych normal mood and affect Neuro alert and oriented x 3 provides hx and follows commands  Skin raynaud's - hands   Medications reviewed   Labs:     Latest Ref Rng & Units 07/24/2023    7:22 AM 07/23/2023   10:49 AM 07/22/2023    7:47 AM  BMP  Glucose 70 - 99 mg/dL 80  161  81   BUN 6 - 20 mg/dL 096  045  409   Creatinine 0.44 - 1.00 mg/dL 8.11  9.14  7.82   Sodium 135 - 145 mmol/L 135  136  139   Potassium 3.5 - 5.1 mmol/L 4.8  4.3  4.4   Chloride 98 - 111 mmol/L 108  108  111   CO2 22 - 32 mmol/L 18  18  19    Calcium  8.9 - 10.3 mg/dL 8.1  8.0  8.0      Assessment/Plan:    # HTN emergency - improved - We have started clonidine  0.1 mg BID here - she reports  being very sleepy.  Will stop clonidine  given same  - Previously on dilt.  We transitioned to amlodipine  for now given her heart rate.  The calcium  channel blocker will also help with Raynaud's.  Would continue amlodipine  on discharge - Increase hydralazine  to 100 mg TID - Reduce prednisone  to 30 mg daily for now (reduction  from pred 60 mg daily at home and 40 mg daily on admission)  - On discharge can resume lasix  40 mg daily PRN swelling - continue pantoprazole  for GI ppx - continue vit D 2000 units daily    # AKI - Lupus nephritis as well as pre-renal insults.  - some improvement after admission here  - s/p renal biopsy on 6/12 with IR. Got DDAVP  - Can resume Lasix  40 mg daily as needed - on discharge can resume telmisartan at 20 mg dose     # Lupus nephritis  - s/p renal biopsy on 6/13 - on myfortic  and plaquenil   - Increased myfortic  to 360 mg BID - on prednisone  - I have decreased dose slightly to 40 mg daily.  Continue same for now - complement ok and anti-dsDNA elevated  - not a candidate for benlysta with depression per charting.  Not sure if saphnelo would be a long-term option.  Note has previously gotten cytoxan .  Would discuss outpatient with renal and rheum     # CKD stage 3b  - baseline Cr 1.35 - 1.6 from 11/2022 to 02/2023 with recent AKI event  - follows with Dr. Irene Johns with [facility]    Disposition - She is acceptable for discharge from a strictly renal standpoint.  I have requested follow-up with Dr. Irene Johns with our office or extender within 1-2 weeks.  The patient tells me that she has follow-up on next Wednesday with an extender.  She is also going to reach out to Dr. Ebbie Johns to set up rheum follow-up, as well   Ariel Aver, MD 07/24/2023 3:35 PM

## 2023-07-24 NOTE — Progress Notes (Signed)
 PROGRESS NOTE        PATIENT DETAILS Name: Ariel Johns Age: 38 y.o. Sex: female Date of Birth: May 03, 1985 Admit Date: 07/19/2023 Admitting Physician Modena Andes, MD [id number], No Pcp Per  Brief Summary: Patient is a 38 y.o.  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  Significant events: 6/10>> admit to [facility]  Significant studies: 6/9>> CXR: No pneumonia 6/9>> renal ultrasound: No hydronephrosis 6/9>> UA:> 300 protein. 6/9>> urine protein/creatinine ratio: 4.04. 6/9>>ANA: +ve 6/9>> anti-dsDNA: 150 (elevated) 6/9>> C3/C4: normal limits 6/10>> CT head: No acute intracranial abnormality  Significant microbiology data: None  Procedures: 6/12>> s/p left renal biopsy by IR    Consults: Nephrology  Subjective:  Patient in bed, denies any headache, still has right-sided pleuritic chest pain and some exertional shortness of breath, no abdominal pain no focal weakness.  No blood in stool or urine.   Objective: Vitals: Blood pressure (!) 151/91, pulse 63, temperature 97.9 F (36.6 C), temperature source Oral, resp. rate 12, height 5' 6 (1.676 m), weight 111.8 kg, last menstrual period 06/21/2023, SpO2 100%.   Exam:  Awake Alert, No new F.N deficits, Normal affect [place].AT,PERRAL Supple Neck, No JVD,   Symmetrical Chest wall movement, Good air movement bilaterally, CTAB RRR,No Gallops, Rubs or new Murmurs,  +ve B.Sounds, Abd Soft, No tenderness,   No Cyanosis, Clubbing or edema    Assessment/Plan:  Ongoing right-sided pleuritic chest pain.  History of DVT in the past, hypercoagulable due to underlying history of lupus and lupus nephritis.  Check echo, EKG, lower extremity venous duplex and VQ scan.  Case discussed with IR, since she is > 24 hours  off of kidney biopsy she is cleared to take anticoagulation per Dr. Fernando Hoyer.  Hypertensive emergency BP improved but overall still elevated on combination of Norvasc , Catapres  and hydralazine , hydralazine  dose adjusted on 07/24/2023 for better control.  AKI on CKD stage IIIa with history of lupus nephritis AKI felt to be multifactorial-possible uncontrolled hypertension-underlying lupus nephritis Lupus serology with elevated dsDNA-but normal complements 6/12 S/p Left Renal biopsy done by IR,  biopsy results noted by nephrology - Focal class III lupus nephritis.   Will require close outpatient nephrology follow-up, case discussed with nephrology, continue Plaquenil , Myfortic  and prednisone  along with oral bicarb.  Normocytic anemia Mild Likely secondary to acute illness/CKD Trend CBC periodically Iron, folate and B12 level, pending report  Hypocalcemia, mild, check vitamin D  level   Class 2 Obesity: Estimated body mass index is 39.78 kg/m as calculated from the following:   Height as of this encounter: 5' 6 (1.676 m).   Weight as of this encounter: 111.8 kg.   Code status:   Code Status: Full Code   DVT Prophylaxis: heparin  injection 5,000 Units Start: 07/21/23 1400 SCDs Start: 07/20/23 0227   Family Communication:  Spouse at bedside.   Disposition Plan: Status is: Inpatient Remains inpatient appropriate because: Severity of illness   Planned Discharge Destination:Home most likely tomorrow a.m. if remains stable   Diet: Diet Order             Diet Heart Room service appropriate? Yes; Fluid consistency: Thin  Diet effective now  Antimicrobial agents: Anti-infectives (From admission, onward)    Start     Dose/Rate Route Frequency Ordered Stop   07/20/23 0400  hydroxychloroquine  (PLAQUENIL ) tablet 200 mg        200 mg Oral 2 times daily 07/20/23 0225          Data Review:   Inpatient Medications  Scheduled Meds:   amLODipine   10 mg Oral Daily   cholecalciferol   2,000 Units Oral Daily   cloNIDine   0.1 mg Oral BID   heparin  injection (subcutaneous)  5,000 Units Subcutaneous Q8H   hydrALAZINE   100 mg Oral TID   hydroxychloroquine   200 mg Oral BID   mycophenolate   360 mg Oral BID   pantoprazole   40 mg Oral Daily   predniSONE   40 mg Oral Q breakfast   sodium bicarbonate   650 mg Oral TID   Vitamin D  (Ergocalciferol )  50,000 Units Oral Q7 days   Continuous Infusions: PRN Meds:.acetaminophen  **OR** acetaminophen , hydrALAZINE   DVT Prophylaxis  heparin  injection 5,000 Units Start: 07/21/23 1400 SCDs Start: 07/20/23 0227   Recent Labs  Lab 07/19/23 1935 07/21/23 0825 07/22/23 0747 07/23/23 1049  WBC 9.0 9.4 8.8 10.8*  HGB 12.1 11.1* 10.5* 9.6*  HCT 38.3 34.2* 33.2* 29.9*  PLT 281 232 215 191  MCV 83.6 83.0 84.7 84.5  MCH 26.4 26.9 26.8 27.1  MCHC 31.6 32.5 31.6 32.1  RDW 14.3 14.6 14.6 14.9    Recent Labs  Lab 07/19/23 1935 07/20/23 0421 07/21/23 0825 07/22/23 0747 07/23/23 1049  NA 136 135 135 139 136  K 4.6 3.9 4.0 4.4 4.3  CL 104 107 106 111 108  CO2 19* 19* 20* 19* 18*  ANIONGAP 13 9 9 9 10   GLUCOSE 121* 97 101* 81 156*  BUN 126* 114* 114* 111* 102*  CREATININE 3.43* 2.91* 3.07* 2.85* 2.86*  AST 19  --   --   --   --   ALT 22  --   --   --   --   ALKPHOS 38  --   --   --   --   BILITOT 0.7  --   --   --   --   ALBUMIN 3.0*  --   --  2.3*  --   INR  --   --   --  1.0  --   HGBA1C  --   --   --  5.3  --   MG  --   --   --   --  2.8*  PHOS  --   --   --  5.1* 4.1  CALCIUM  9.4 7.9* 7.9* 8.0* 8.0*      Recent Labs  Lab 07/19/23 1935 07/20/23 0421 07/21/23 0825 07/22/23 0747 07/23/23 1049  INR  --   --   --  1.0  --   HGBA1C  --   --   --  5.3  --   MG  --   --   --   --  2.8*  CALCIUM  9.4 7.9* 7.9* 8.0* 8.0*    --------------------------------------------------------------------------------------------------------------- No results found for: CHOL, HDL,  LDLCALC, LDLDIRECT, TRIG, CHOLHDL  Lab Results  Component Value Date   HGBA1C 5.3 07/22/2023   No results for input(s): TSH, T4TOTAL, FREET4, T3FREE, THYROIDAB in the last 72 hours. Recent Labs    07/23/23 1049 07/23/23 2020  VITAMINB12  --  724  FOLATE 8.9  --   TIBC  --  245*  IRON  --  84   ------------------------------------------------------------------------------------------------------------------ Cardiac Enzymes No results for input(s): CKMB, TROPONINI, MYOGLOBIN in the last 168 hours.  Invalid input(s): CK  Micro Results No results found for this or any previous visit (from the past 240 hours).  Radiology Reports  US  BIOPSY (KIDNEY) Result Date: 07/22/2023 INDICATION: Acute kidney injury EXAM: Ultrasound-guided renal biopsy MEDICATIONS: None. ANESTHESIA/SEDATION: Moderate (conscious) sedation was employed during this procedure. A total of Versed  2 mg and Fentanyl  50 mcg was administered intravenously by the radiology nurse. Total intra-service moderate Sedation Time: 34 minutes. The patient's level of consciousness and vital signs were monitored continuously by radiology nursing throughout the procedure under my direct supervision. COMPLICATIONS: None immediate. PROCEDURE: Informed written consent was obtained from the patient after a thorough discussion of the procedural risks, benefits and alternatives. All questions were addressed. Maximal Sterile Barrier Technique was utilized including caps, mask, sterile gowns, sterile gloves, sterile drape, hand hygiene and skin antiseptic. A timeout was performed prior to the initiation of the procedure. With the patient in a prone position both kidneys were evaluated with ultrasound for optimal access. The left kidney was chosen for access in the skin was marked. The patient was then prepped and draped in usual sterile fashion. Local anesthesia was achieved with 1% lidocaine . Small incision was made the  patient's left flank region and the access needle was advanced with ultrasound guidance by advancing the needle from the skin to the renal cortex under ultrasound guidance visualizing the needle to the level of the lower pole the left kidney. Once the needle was identified in this position, the BioPince biopsy needle was prepped and advanced through the cannula to the lower pole of the left kidney. A total of 3 biopsy samples were obtained. Post sampling Gelfoam administration was performed. No active hemorrhage identified at the end the procedure. IMPRESSION: Satisfactory core needle biopsy lower pole left kidney with ultrasound guidance. Electronically Signed   By: Susan Ensign   On: 07/22/2023 15:48      LOS: 4 days   Signature  -    Lynnwood Sauer M.D on 07/24/2023 at 8:41 AM   -  To page go to [email or web address]

## 2023-07-24 NOTE — Plan of Care (Signed)
 Patient had a good night. No c/o pain. No s/s distress. PO meds administered with no issues. Patient stated that she is expecting to be discharged today.

## 2023-07-24 NOTE — Plan of Care (Signed)
  Problem: Education: Goal: Knowledge of disease and its progression will improve Outcome: Progressing   Problem: Fluid Volume: Goal: Fluid volume balance will be maintained or improved Outcome: Progressing   Problem: Urinary Elimination: Goal: Progression of disease will be identified and treated Outcome: Progressing   

## 2023-07-25 DIAGNOSIS — N179 Acute kidney failure, unspecified: Secondary | ICD-10-CM | POA: Diagnosis not present

## 2023-07-25 MED ORDER — AMLODIPINE BESYLATE 10 MG PO TABS: 10.0000 mg | ORAL_TABLET | Freq: Every day | ORAL | 0 refills | Status: AC

## 2023-07-25 MED ORDER — PREDNISONE 10 MG PO TABS
30.0000 mg | ORAL_TABLET | ORAL | 0 refills | Status: AC
Start: 2023-07-25 — End: ?

## 2023-07-25 MED ORDER — PANTOPRAZOLE SODIUM 40 MG PO TBEC: 40.0000 mg | DELAYED_RELEASE_TABLET | Freq: Every day | ORAL | 0 refills | Status: AC

## 2023-07-25 MED ORDER — MYCOPHENOLATE SODIUM 360 MG PO TBEC
360.0000 mg | DELAYED_RELEASE_TABLET | Freq: Two times a day (BID) | ORAL | 0 refills | Status: AC
Start: 2023-07-25 — End: ?

## 2023-07-25 MED ORDER — SODIUM BICARBONATE 650 MG PO TABS: 650.0000 mg | ORAL_TABLET | Freq: Two times a day (BID) | ORAL | 0 refills | Status: AC

## 2023-07-25 MED ORDER — FUROSEMIDE 40 MG PO TABS
ORAL_TABLET | ORAL | 0 refills | Status: AC
Start: 2023-07-25 — End: ?

## 2023-07-25 MED ORDER — VITAMIN D3 25 MCG PO TABS: 2000.0000 [IU] | ORAL_TABLET | Freq: Every day | ORAL | 0 refills | Status: AC

## 2023-07-25 MED ORDER — HYDRALAZINE HCL 100 MG PO TABS: 100.0000 mg | ORAL_TABLET | Freq: Three times a day (TID) | ORAL | 0 refills | Status: AC

## 2023-07-25 NOTE — Discharge Instructions (Addendum)
 Do not drive, operate heavy machinery, perform activities at heights, swimming or participation in water activities or provide baby sitting services until you have seen by Primary MD or a Neurologist and advised to do so again.  Follow with Primary MD & rheumatologist within 7 days   Get CBC, CMP, 2 view Chest X ray -  checked next visit with your primary MD   Activity: As tolerated with Full fall precautions use walker/cane & assistance as needed  Disposition Home   Diet: Renal diet with strict 1.5 L fluid restriction per day  Special Instructions: If you have smoked or chewed Tobacco  in the last 2 yrs please stop smoking, stop any regular Alcohol  and or any Recreational drug use.  On your next visit with your primary care physician please Get Medicines reviewed and adjusted.  Please request your Prim.MD to go over all Hospital Tests and Procedure/Radiological results at the follow up, please get all Hospital records sent to your Prim MD by signing hospital release before you go home.  If you experience worsening of your admission symptoms, develop shortness of breath, life threatening emergency, suicidal or homicidal thoughts you must seek medical attention immediately by calling 911 or calling your MD immediately  if symptoms less severe.  You Must read complete instructions/literature along with all the possible adverse reactions/side effects for all the Medicines you take and that have been prescribed to you. Take any new Medicines after you have completely understood and accpet all the possible adverse reactions/side effects.   Do not drive when taking Pain medications.  Do not take more than prescribed Pain, Sleep and Anxiety Medications  Wear Seat belts while driving.

## 2023-07-25 NOTE — Progress Notes (Signed)
 DISCHARGE NOTE HOME Ariel Johns to be discharged Home per MD order. Discussed prescriptions and follow up appointments with the patient. Prescriptions given to patient; medication list explained in detail. Patient verbalized understanding.  Patient will take morning meds once gets home.  Skin clean, dry and intact without evidence of skin break down, no evidence of skin tears noted. IV catheter discontinued intact. Site without signs and symptoms of complications. Dressing and pressure applied. Pt denies pain at the site currently. No complaints noted.  See LDA for incision sites at discharge  Patient free of other lines, drains, and wounds.   An After Visit Summary (AVS) was printed and given to the patient. Patient escorted via wheelchair, and discharged home via private auto.  Tonda Francisco, RN

## 2023-07-25 NOTE — Discharge Summary (Addendum)
 Ariel Johns:[id number] DOB: [birth year] DOA: 07/19/2023  PCP: Patient, No Pcp Per  Admit date: 07/19/2023  Discharge date: 07/25/2023  Admitted From: Home   Disposition:  Home   Recommendations for Outpatient Follow-up:   Follow up with PCP in 1-2 weeks  PCP Please obtain BMP/CBC, 2 view CXR in 1week,  (see Discharge instructions)   PCP Please follow up on the following pending results:    Home Health: None   Equipment/Devices: None  Consultations: Renal, IR Discharge Condition: Stable    CODE STATUS: Full    Diet Recommendation: Renal Diet with 1.5 L fluid restriction per day  Chief Complaint  Patient presents with   Hypertension     Brief history of present illness from the day of admission and additional interim summary    38 y.o.  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   Significant events: 6/10>> admit to [facility]   Significant studies: 6/9>> CXR: No pneumonia 6/9>> renal ultrasound: No hydronephrosis 6/9>> UA:> 300 protein. 6/9>> urine protein/creatinine ratio: 4.04. 6/9>>ANA: +ve 6/9>> anti-dsDNA: 150 (elevated) 6/9>> C3/C4: normal limits 6/10>> CT head: No acute intracranial abnormality   Significant microbiology data: None   Procedures: 6/12>> s/p left renal biopsy by [facility]   Ongoing right-sided pleuritic chest pain.  History of DVT in the past, hypercoagulable due to underlying history of lupus and lupus nephritis.  Stable chest x-ray, EKG, VQ scan unremarkable, chest discomfort resolved after a dose of  Lasix , she is now symptom-free.  She is now symptom-free from the standpoint and eager to go home.   Hypertensive emergency BP improved today on combination of Norvasc  and hydralazine , PCP to monitor and adjust.   AKI on CKD stage IIIa with history of lupus nephritis AKI felt to be multifactorial-possible uncontrolled hypertension-underlying lupus nephritis Lupus serology with elevated dsDNA-but normal complements 6/12 S/p Left Renal biopsy done by IR,  biopsy results noted by nephrology - Focal class III lupus nephritis.   Will require close outpatient nephrology follow-up, case discussed with nephrology, continue Plaquenil , Myfortic  and prednisone  along with oral bicarb.  Will be discharged with close outpatient nephrology follow-up.   Normocytic anemia AOCD.  Stable.   Hypocalcemia, low vitamin D  levels, start replacement, nephrology and PCP to follow    Class 2 Obesity: Estimated body mass index is 39.78 kg/m, follow-up with PCP for weight loss  Discharge diagnosis     Principal Problem:   AKI (acute kidney injury) (HCC)  Active Problems:   SLE (systemic lupus erythematosus) (HCC)   Hypertensive urgency   Dyspnea on exertion   Sinus bradycardia   Lupus nephritis [facility])    Discharge instructions    Discharge Instructions     Discharge instructions   Complete by: As directed    Follow with Primary MD & rheumatologist within 7 days   Get CBC, CMP, 2 view Chest X ray -  checked next visit with your primary MD   Activity: As tolerated with Full fall precautions use walker/cane & assistance as needed  Disposition Home   Diet: Renal diet with strict 1.5 L fluid restriction per day  Special Instructions: If you have smoked or chewed Tobacco  in the last 2 yrs please stop smoking, stop any regular Alcohol  and or any Recreational drug use.  On your next visit with your primary care physician please Get Medicines reviewed and adjusted.  Please request your Prim.MD to go  over all Hospital Tests and Procedure/Radiological results at the follow up, please get all Hospital records sent to your Prim MD by signing hospital release before you go home.  If you experience worsening of your admission symptoms, develop shortness of breath, life threatening emergency, suicidal or homicidal thoughts you must seek medical attention immediately by calling 911 or calling your MD immediately  if symptoms less severe.  You Must read complete instructions/literature along with all the possible adverse reactions/side effects for all the Medicines you take and that have been prescribed to you. Take any new Medicines after you have completely understood and accpet all the possible adverse reactions/side effects.   Do not drive when taking Pain medications.  Do not take more than prescribed Pain, Sleep and Anxiety Medications  Wear Seat belts while driving.   Increase activity slowly   Complete by: As directed    No wound care   Complete by: As directed        Discharge Medications   Allergies as of 07/25/2023       Reactions   Codeine Hives   Vancomycin  Other (See Comments)   Adversely affects renal functioning.   Amoxicillin Rash, Other (See Comments)   Sick on stomach        Medication List     STOP taking these medications    Dilt-XR 120 MG 24 hr capsule Generic drug: diltiazem        TAKE these medications    amLODipine  10 MG tablet Commonly known as: NORVASC  Take 1 tablet (10 mg total) by mouth daily.   furosemide  40 MG tablet Commonly known as: LASIX  Take once daily as needed for increased swelling or increase in weight more than 2.5 pounds from baseline. What changed:  how much to take how to take this when to take this additional instructions   hydrALAZINE  100 MG tablet Commonly known as: APRESOLINE  Take 1 tablet (100 mg total) by mouth 3 (three) times daily.   hydroxychloroquine  200 MG tablet Commonly known as: PLAQUENIL  Take 200 mg  by mouth 2 (two) times daily.   mycophenolate  360 MG Tbec EC tablet Commonly known as: Myfortic  Take 1 tablet (360 mg total) by mouth 2 (two) times daily. What changed:  medication strength how much to take   pantoprazole  40 MG tablet Commonly known as: PROTONIX  Take 1 tablet (40 mg total) by mouth daily.   predniSONE  10 MG tablet Commonly known as: DELTASONE  Take 3 tablets (30 mg total) by mouth See admin instructions. Take 2 tablets by mouth  in the morning and 1 tablet in the at bedtime What changed:  medication strength how much to take   ranitidine 150 MG tablet Commonly known as: ZANTAC Take 150 mg by mouth daily.   sodium bicarbonate  650 MG tablet Take 1 tablet (650 mg total) by mouth 2 (two) times daily.   vitamin D3 25 MCG tablet Commonly known as: CHOLECALCIFEROL  Take 2 tablets (2,000 Units total) by mouth daily.         Follow-up Information     [facility]. Schedule an appointment as soon as possible for a visit in 1 week(s).   Why: Follow-up with your rheumatologist and your PCP within a week. Contact information: [address] [place]  [address] [phone]        Nan Aver, MD. Schedule an appointment as soon as possible for a visit in 1 week(s).   Specialty: Nephrology Contact information: [address] [place] [address] [phone]                 Major procedures and Radiology Reports - PLEASE review detailed and final reports thoroughly  -       NM Pulmonary Perfusion Result Date: 07/24/2023 CLINICAL DATA:  Pulmonary embolism (PE) suspected, high prob EXAM: NUCLEAR MEDICINE PERFUSION LUNG SCAN TECHNIQUE: Perfusion images were obtained in multiple projections after intravenous injection of radiopharmaceutical. Ventilation scans intentionally deferred if perfusion scan and chest x-ray adequate for interpretation during COVID 19 epidemic. RADIOPHARMACEUTICALS:  4.32  mCi Tc-74m MAA IV COMPARISON:  Same day radiograph FINDINGS: Homogeneous distribution of radiotracer is visualized throughout bilateral lungs. No wedge-shaped radiotracer defect is identified. IMPRESSION: PE absent Electronically Signed   By: Clancy Crimes M.D.   On: 07/24/2023 12:18   DG Chest Port 1 View Result Date: 07/24/2023 CLINICAL DATA:  38 year old female with shortness of breath. EXAM: PORTABLE CHEST 1 VIEW COMPARISON:  Chest radiograph 07/19/2023 and earlier. FINDINGS: Portable AP view at 1006 hours. Lung volumes and mediastinal contours are stable and within normal limits, mild tortuosity of the thoracic aorta. Visualized tracheal air column is within normal limits. Stable ventilation and when allowing for portable technique the lungs are clear. Negative visible bowel gas and osseous structures. IMPRESSION: Negative portable chest. Electronically Signed   By: Marlise Simpers M.D.   On: 07/24/2023 10:44   US  BIOPSY (KIDNEY) Result Date: 07/22/2023 INDICATION: Acute kidney injury EXAM: Ultrasound-guided renal biopsy MEDICATIONS: None. ANESTHESIA/SEDATION: Moderate (conscious) sedation was employed during this procedure. A total of Versed  2 mg and Fentanyl  50 mcg was administered intravenously by the radiology nurse. Total intra-service moderate Sedation Time: 34 minutes. The patient's level of consciousness and vital signs were monitored continuously by radiology nursing throughout the procedure under my direct supervision. COMPLICATIONS: None immediate. PROCEDURE: Informed written consent was obtained from the patient after a thorough discussion of the procedural risks, benefits and alternatives. All questions were addressed. Maximal Sterile Barrier Technique was utilized including caps, mask, sterile gowns, sterile gloves, sterile drape, hand hygiene and skin antiseptic. A timeout was performed prior to the initiation of the procedure. With the patient in a prone position both kidneys were evaluated  with ultrasound for optimal access. The left kidney was chosen for access in the skin was marked. The patient was then prepped and draped in usual sterile fashion. Local anesthesia was achieved with 1% lidocaine . Small incision was made the patient's left flank region and the access needle was advanced with ultrasound guidance by  advancing the needle from the skin to the renal cortex under ultrasound guidance visualizing the needle to the level of the lower pole the left kidney. Once the needle was identified in this position, the BioPince biopsy needle was prepped and advanced through the cannula to the lower pole of the left kidney. A total of 3 biopsy samples were obtained. Post sampling Gelfoam administration was performed. No active hemorrhage identified at the end the procedure. IMPRESSION: Satisfactory core needle biopsy lower pole left kidney with ultrasound guidance. Electronically Signed   By: Susan Ensign   On: 07/22/2023 15:48   CT HEAD WO CONTRAST ( ) Result Date: 07/20/2023 CLINICAL DATA:  Sudden severe headache and hypertension EXAM: CT HEAD WITHOUT CONTRAST TECHNIQUE: Contiguous axial images were obtained from the base of the skull through the vertex without intravenous contrast. RADIATION DOSE REDUCTION: This exam was performed according to the departmental dose-optimization program which includes automated exposure control, adjustment of the mA and/or kV according to patient size and/or use of iterative reconstruction technique. COMPARISON:  CT head 01/23/2009 FINDINGS: Brain: No intracranial hemorrhage, mass effect, or evidence of acute infarct. No hydrocephalus. No extra-axial fluid collection. Chronic left temporoparietal encephalomalacia. Vascular: No hyperdense vessel or unexpected calcification. Skull: No fracture or focal lesion. Sinuses/Orbits: No acute finding. Other: None. IMPRESSION: No acute intracranial abnormality. Chronic left temporoparietal encephalomalacia. Electronically  Signed   By: Rozell Cornet M.D.   On: 07/20/2023 03:08   US  Renal Result Date: 07/19/2023 CLINICAL DATA:  Renal failure EXAM: RENAL / URINARY TRACT ULTRASOUND COMPLETE COMPARISON:  None Available. FINDINGS: Right Kidney: Renal measurements: 11.4 x 5.7 x 4.7 cm = volume: 159 mL. Echogenic cortex. Multiple, fewer than 10 cysts. The largest is seen at the lower pole and measures 2.9 x 2.7 x 3.1 cm. No imaging follow-up is recommended Left Kidney: Renal measurements: 10.2 x 5.7 x 4.8 cm = volume: 146.7 mL. Echogenic cortex. No hydronephrosis or mass Bladder: Appears normal for degree of bladder distention. Other: None. IMPRESSION: Echogenic renal cortices consistent with medical renal disease. No hydronephrosis. Electronically Signed   By: Esmeralda Hedge M.D.   On: 07/19/2023 22:48   DG Chest 2 View Result Date: 07/19/2023 CLINICAL DATA:  HTN , bilateral lower extremity swelling EXAM: CHEST - 2 VIEW COMPARISON:  January 28, 2023 FINDINGS: No focal airspace consolidation, pleural effusion, or pneumothorax. No cardiomegaly. No acute fracture or destructive lesion. Multilevel thoracic osteophytosis. IMPRESSION: No acute cardiopulmonary abnormality. Electronically Signed   By: Rance Burrows M.D.   On: 07/19/2023 20:02    Micro Results     No results found for this or any previous visit (from the past 240 hours).  Today   Subjective    Taresa [place] today has no headache,no chest abdominal pain,no new weakness tingling or numbness, feels much better wants to go home today.     Objective   Blood pressure (!) 139/90, pulse 74, temperature 98 F (36.7 C), temperature source Oral, resp. rate 13, height 5' 6 (1.676 m), weight 111.8 kg, last menstrual period 06/21/2023, SpO2 99%.   Intake/Output Summary (Last 24 hours) at 07/25/2023 0815 Last data filed at 07/25/2023 0433 Gross per 24 hour  Intake --  Output 1250 ml  Net -1250 ml    Exam  Awake Alert, No new F.N deficits,     [place].AT,PERRAL Supple Neck,   Symmetrical Chest wall movement, Good air movement bilaterally, CTAB RRR,No Gallops,   +ve B.Sounds, Abd Soft, Non tender,  No Cyanosis, Clubbing or  edema    Data Review   Recent Labs  Lab 07/19/23 1935 07/21/23 0825 07/22/23 0747 07/23/23 1049 07/24/23 0722  WBC 9.0 9.4 8.8 10.8* 10.4  HGB 12.1 11.1* 10.5* 9.6* 9.6*  HCT 38.3 34.2* 33.2* 29.9* 30.4*  PLT 281 232 215 191 201  MCV 83.6 83.0 84.7 84.5 85.2  MCH 26.4 26.9 26.8 27.1 26.9  MCHC 31.6 32.5 31.6 32.1 31.6  RDW 14.3 14.6 14.6 14.9 14.8    Recent Labs  Lab 07/19/23 1935 07/20/23 0421 07/21/23 0825 07/22/23 0747 07/23/23 1049 07/24/23 0722  NA 136 135 135 139 136 135  K 4.6 3.9 4.0 4.4 4.3 4.8  CL 104 107 106 111 108 108  CO2 19* 19* 20* 19* 18* 18*  ANIONGAP 13 9 9 9 10 9   GLUCOSE 121* 97 101* 81 156* 80  BUN 126* 114* 114* 111* 102* 109*  CREATININE 3.43* 2.91* 3.07* 2.85* 2.86* 2.73*  AST 19  --   --   --   --   --   ALT 22  --   --   --   --   --   ALKPHOS 38  --   --   --   --   --   BILITOT 0.7  --   --   --   --   --   ALBUMIN 3.0*  --   --  2.3*  --  2.3*  INR  --   --   --  1.0  --   --   HGBA1C  --   --   --  5.3  --   --   MG  --   --   --   --  2.8*  --   PHOS  --   --   --  5.1* 4.1 3.9  CALCIUM  9.4 7.9* 7.9* 8.0* 8.0* 8.1*    Total Time in preparing paper work, data evaluation and todays exam - 35 minutes  Signature  -    Lynnwood Sauer M.D on 07/25/2023 at 8:15 AM   -  To page go to [email or web address]

## 2023-08-03 ENCOUNTER — Encounter (HOSPITAL_COMMUNITY): Payer: Self-pay

## 2023-08-03 LAB — SURGICAL PATHOLOGY

## 2023-12-15 ENCOUNTER — Encounter (INDEPENDENT_AMBULATORY_CARE_PROVIDER_SITE_OTHER): Admitting: Ophthalmology

## 2023-12-15 DIAGNOSIS — H35712 Central serous chorioretinopathy, left eye: Secondary | ICD-10-CM | POA: Diagnosis not present

## 2023-12-15 DIAGNOSIS — H43813 Vitreous degeneration, bilateral: Secondary | ICD-10-CM

## 2024-01-12 ENCOUNTER — Encounter (INDEPENDENT_AMBULATORY_CARE_PROVIDER_SITE_OTHER): Admitting: Ophthalmology

## 2024-01-12 DIAGNOSIS — H35712 Central serous chorioretinopathy, left eye: Secondary | ICD-10-CM

## 2024-01-12 DIAGNOSIS — H43813 Vitreous degeneration, bilateral: Secondary | ICD-10-CM | POA: Diagnosis not present

## 2024-01-12 DIAGNOSIS — H2513 Age-related nuclear cataract, bilateral: Secondary | ICD-10-CM | POA: Diagnosis not present

## 2024-01-26 ENCOUNTER — Emergency Department (HOSPITAL_BASED_OUTPATIENT_CLINIC_OR_DEPARTMENT_OTHER)
Admission: EM | Admit: 2024-01-26 | Discharge: 2024-01-26 | Disposition: A | Discharge status: Home / Self Care | Attending: Emergency Medicine | Admitting: Emergency Medicine

## 2024-01-26 ENCOUNTER — Encounter (HOSPITAL_BASED_OUTPATIENT_CLINIC_OR_DEPARTMENT_OTHER): Payer: Self-pay

## 2024-01-26 DIAGNOSIS — R030 Elevated blood-pressure reading, without diagnosis of hypertension: Secondary | ICD-10-CM | POA: Diagnosis present

## 2024-01-26 DIAGNOSIS — I158 Other secondary hypertension: Secondary | ICD-10-CM | POA: Diagnosis not present

## 2024-01-26 DIAGNOSIS — N185 Chronic kidney disease, stage 5: Secondary | ICD-10-CM | POA: Insufficient documentation

## 2024-01-26 DIAGNOSIS — Z79899 Other long term (current) drug therapy: Secondary | ICD-10-CM | POA: Diagnosis not present

## 2024-01-26 LAB — CBC WITH DIFFERENTIAL/PLATELET
Abs Immature Granulocytes: 0.06 K/uL (ref 0.00–0.07)
Basophils Absolute: 0 K/uL (ref 0.0–0.1)
Basophils Relative: 0 %
Eosinophils Absolute: 0 K/uL (ref 0.0–0.5)
Eosinophils Relative: 0 %
HCT: 29.2 % — ABNORMAL LOW (ref 36.0–46.0)
Hemoglobin: 9.3 g/dL — ABNORMAL LOW (ref 12.0–15.0)
Immature Granulocytes: 1 %
Lymphocytes Relative: 12 %
Lymphs Abs: 0.8 K/uL (ref 0.7–4.0)
MCH: 24.7 pg — ABNORMAL LOW (ref 26.0–34.0)
MCHC: 31.8 g/dL (ref 30.0–36.0)
MCV: 77.5 fL — ABNORMAL LOW (ref 80.0–100.0)
Monocytes Absolute: 0.4 K/uL (ref 0.1–1.0)
Monocytes Relative: 5 %
Neutro Abs: 5.8 K/uL (ref 1.7–7.7)
Neutrophils Relative %: 82 %
Platelets: 317 K/uL (ref 150–400)
RBC: 3.77 MIL/uL — ABNORMAL LOW (ref 3.87–5.11)
RDW: 14.3 % (ref 11.5–15.5)
WBC: 7.1 K/uL (ref 4.0–10.5)
nRBC: 0 % (ref 0.0–0.2)

## 2024-01-26 LAB — BASIC METABOLIC PANEL WITH GFR
Anion gap: 12 (ref 5–15)
BUN: 48 mg/dL — ABNORMAL HIGH (ref 6–20)
CO2: 20 mmol/L — ABNORMAL LOW (ref 22–32)
Calcium: 9 mg/dL (ref 8.9–10.3)
Chloride: 104 mmol/L (ref 98–111)
Creatinine, Ser: 4.96 mg/dL — ABNORMAL HIGH (ref 0.44–1.00)
GFR, Estimated: 11 mL/min — ABNORMAL LOW (ref >60)
Glucose, Bld: 100 mg/dL — ABNORMAL HIGH (ref 70–99)
Potassium: 4.4 mmol/L (ref 3.5–5.1)
Sodium: 136 mmol/L (ref 135–145)

## 2024-01-26 MED ORDER — HYDRALAZINE HCL 25 MG PO TABS
100.0000 mg | ORAL_TABLET | Freq: Once | ORAL | Status: AC
  Administered 2024-01-26: 14:00:00 100 mg via ORAL
  Filled 2024-01-26: qty 4

## 2024-01-26 MED ORDER — CLONIDINE HCL 0.1 MG PO TABS
0.1000 mg | ORAL_TABLET | Freq: Once | ORAL | Status: AC
  Administered 2024-01-26: 13:00:00 0.1 mg via ORAL
  Filled 2024-01-26: qty 1

## 2024-01-26 NOTE — Discharge Instructions (Signed)
 Continue your current medications and try to take the clonidine  at noon also if you manage it.  Labs today were a little bit improved.  Blood pressure now looks much better.  Follow up with your doctors but return to the ER if you start having severe headache, vision changes, voimting, weight gain, leg swelling or other concerns.

## 2024-01-26 NOTE — ED Notes (Signed)
IV removed from [facility] , cath intact

## 2024-01-26 NOTE — ED Triage Notes (Signed)
 She tells me she has known lupus kidney disease and that makes my blood pressure high. She tells me her nephrologist recommended she com to hospital. She is ambulatory and in no distress. She had c/o mild h/a earlier today but it went away when I took my morning meds.

## 2024-01-26 NOTE — ED Provider Notes (Signed)
 [facility] AT [facility] Provider Note   CSN: [id number] Arrival date & time: 01/26/24  1149     Patient presents with: Hypertension   Ariel Johns is a 38 y.o. female.   Patient is a 38 year old female with a history of lupus, lupus nephritis, hypertension, chronic kidney disease which has been worsening who is presenting today due to persistently elevated blood pressure.  Patient reports that she initially had been on amlodipine , hydralazine  and telmisartan.  She is compliant with those medications she reports approximately 80% of the time because sometimes she just forgets to take them.  She has been following closely with her nephrologist and since August patient's GFR and creatinine have been gradually worsening.  When she went and saw him last week her blood pressure was more elevated than what it had been in the past and she was started on 0.1 mg of clonidine  to be taken 3 times a day.  However she reports it makes her tired so she only takes it twice a day.  She will have an occasional headache but denies any chest pain or shortness of breath.  She is not taking any ibuprofen type products.  Her lupus she states has been under control on the Plaquenil .  Because her labs last week were showing worsening renal function now with GFR of 9 she had been talking with her doctors about being put on the transplant list.  She is still making urine has not noticed any swelling in her legs and also reports that her weight has come down and she has not had any weight gain.  Today she had a nurses visit for repeat blood pressure check.  She had taken all of her morning medications but had not taken her afternoon hydralazine  and at the doctor's office her pressure was elevated.  They recommended she come here for better blood pressure control and had mentioned to her admission but patient reports she does not desire to be admitted at this time.  The history is  provided by the patient and medical records.  Hypertension       Prior to Admission medications  Medication Sig Start Date End Date Taking? Authorizing Provider  amLODipine  (NORVASC ) 10 MG tablet Take 1 tablet (10 mg total) by mouth daily. 07/25/23   Singh, Prashant K, MD  cholecalciferol  (CHOLECALCIFEROL ) 25 MCG tablet Take 2 tablets (2,000 Units total) by mouth daily. 07/25/23   Singh, Prashant K, MD  furosemide  (LASIX ) 40 MG tablet Take once daily as needed for increased swelling or increase in weight more than 2.5 pounds from baseline. 07/25/23   Singh, Prashant K, MD  hydrALAZINE  (APRESOLINE ) 100 MG tablet Take 1 tablet (100 mg total) by mouth 3 (three) times daily. 07/25/23   Singh, Prashant K, MD  hydroxychloroquine  (PLAQUENIL ) 200 MG tablet Take 200 mg by mouth 2 (two) times daily.     [provider]  mycophenolate  (MYFORTIC ) 360 MG TBEC EC tablet Take 1 tablet (360 mg total) by mouth 2 (two) times daily. 07/25/23   Dennise Lavada POUR, MD  pantoprazole  (PROTONIX ) 40 MG tablet Take 1 tablet (40 mg total) by mouth daily. 07/25/23   Singh, Prashant K, MD  predniSONE  (DELTASONE ) 10 MG tablet Take 3 tablets (30 mg total) by mouth See admin instructions. Take 2 tablets by mouth in the morning and 1 tablet in the at bedtime 07/25/23   Singh, Prashant K, MD  ranitidine (ZANTAC) 150 MG tablet Take 150 mg by mouth  daily.    [provider]  sodium bicarbonate  650 MG tablet Take 1 tablet (650 mg total) by mouth 2 (two) times daily. 07/25/23   Singh, Prashant K, MD    Allergies: Codeine, Vancomycin , and Amoxicillin    Review of Systems  Updated Vital Signs BP 136/75   Pulse 76   Temp 98.7 F (37.1 C)   Resp 17   LMP 01/12/2024 (Approximate)   SpO2 92%   Physical Exam Vitals and nursing note reviewed.  Constitutional:      General: She is not in acute distress.    Appearance: She is well-developed.  HENT:     Head: Normocephalic and atraumatic.  Eyes:     Pupils: Pupils  are equal, round, and reactive to light.  Cardiovascular:     Rate and Rhythm: Normal rate and regular rhythm.     Heart sounds: Normal heart sounds. No murmur heard.    No friction rub.  Pulmonary:     Effort: Pulmonary effort is normal.     Breath sounds: Normal breath sounds. No wheezing or rales.  Abdominal:     General: Bowel sounds are normal. There is no distension.     Palpations: Abdomen is soft.     Tenderness: There is no abdominal tenderness. There is no guarding or rebound.  Musculoskeletal:        General: No tenderness. Normal range of motion.     Right lower leg: No edema.     Left lower leg: No edema.     Comments: No edema  Skin:    General: Skin is warm and dry.     Findings: No rash.  Neurological:     General: No focal deficit present.     Mental Status: She is alert and oriented to person, place, and time. Mental status is at baseline.     Cranial Nerves: No cranial nerve deficit.  Psychiatric:        Behavior: Behavior normal.     (all labs ordered are listed, but only abnormal results are displayed) Labs Reviewed  CBC WITH DIFFERENTIAL/PLATELET - Abnormal; Notable for the following components:      Result Value   RBC 3.77 (*)    Hemoglobin 9.3 (*)    HCT 29.2 (*)    MCV 77.5 (*)    MCH 24.7 (*)    All other components within normal limits  BASIC METABOLIC PANEL WITH GFR - Abnormal; Notable for the following components:   CO2 20 (*)    Glucose, Bld 100 (*)    BUN 48 (*)    Creatinine, Ser 4.96 (*)    GFR, Estimated 11 (*)    All other components within normal limits  URINALYSIS, ROUTINE W REFLEX MICROSCOPIC    EKG: EKG Interpretation Date/Time:  Wednesday January 26 2024 13:45:32 EST Ventricular Rate:  67 PR Interval:  162 QRS Duration:  89 QT Interval:  395 QTC Calculation: 417 R Axis:   -3  Text Interpretation: Sinus rhythm Left ventricular hypertrophy No significant change since last tracing Confirmed by Doretha Folks ([id number])  on 01/26/2024 1:47:42 PM  Radiology: No results found.   Procedures   Medications Ordered in the ED  hydrALAZINE  (APRESOLINE ) tablet 100 mg (100 mg Oral Given 01/26/24 1357)  cloNIDine  (CATAPRES ) tablet 0.1 mg (0.1 mg Oral Given 01/26/24 1329)  Medical Decision Making Amount and/or Complexity of Data Reviewed Labs: ordered. Decision-making details documented in ED Course. ECG/medicine tests: ordered and independent interpretation performed. Decision-making details documented in ED Course.  Risk Prescription drug management.   Pt with multiple medical problems and comorbidities and presenting today with a complaint that caries a high risk for morbidity and mortality.  Here today with persistent hypertension which is overall mostly asymptomatic.  Suspect this is most likely coming from her worsening renal disease.  She has only been on the clonidine  for 2 days now since her visit on Monday.  She is not taking the noontime dose because of the cause of drowsiness.  She did take it this morning with the amlodipine , telmisartan and hydralazine .  She has also not taken her afternoon dose of hydralazine .  She complains of a minimal headache at this time but has no neurologic findings on exam.  She does not have significant findings for fluid overload.  I independently interpreted patient's EKG and labs.  EKG shows no acute findings.  She does have some chronic changes of LVH but is unchanged from prior. CBC without acute changes.  BMP with persisent kidney disease but slightly improved from last week with GFR of 11 from 9 and Cr of 4.9 from 5.6.  after getting her clonidine  and hydralazine  BP has now been 138/75 for the last 3 reads.  At this time do not feel pt needs admission.  Will have her continue her meds and also take the noon dose and f/u with nephrology.      Final diagnoses:  Other secondary hypertension  Stage 5 chronic kidney disease not on  chronic dialysis [facility])    ED Discharge Orders     None          Doretha Folks, MD 01/26/24 1514

## 2024-03-09 ENCOUNTER — Other Ambulatory Visit: Payer: Self-pay | Admitting: Vascular Surgery

## 2024-03-09 DIAGNOSIS — N186 End stage renal disease: Secondary | ICD-10-CM

## 2024-04-05 ENCOUNTER — Ambulatory Visit (HOSPITAL_COMMUNITY)

## 2024-04-05 ENCOUNTER — Encounter: Admitting: Vascular Surgery
